# Patient Record
Sex: Female | Born: 1975 | Race: White | Hispanic: No | Marital: Married | Smoking: Never smoker
Health system: Southern US, Community
[De-identification: ages and names within clinical notes are randomized; demographics above are authoritative.]

## PROBLEM LIST (undated history)

## (undated) DIAGNOSIS — R51 Headache: Secondary | ICD-10-CM

## (undated) DIAGNOSIS — D72829 Elevated white blood cell count, unspecified: Secondary | ICD-10-CM

## (undated) DIAGNOSIS — R519 Headache, unspecified: Secondary | ICD-10-CM

## (undated) DIAGNOSIS — K760 Fatty (change of) liver, not elsewhere classified: Secondary | ICD-10-CM

## (undated) DIAGNOSIS — T8859XA Other complications of anesthesia, initial encounter: Secondary | ICD-10-CM

## (undated) DIAGNOSIS — Z8719 Personal history of other diseases of the digestive system: Secondary | ICD-10-CM

## (undated) DIAGNOSIS — R112 Nausea with vomiting, unspecified: Secondary | ICD-10-CM

## (undated) DIAGNOSIS — I10 Essential (primary) hypertension: Secondary | ICD-10-CM

## (undated) DIAGNOSIS — F419 Anxiety disorder, unspecified: Secondary | ICD-10-CM

## (undated) DIAGNOSIS — Z9889 Other specified postprocedural states: Secondary | ICD-10-CM

## (undated) DIAGNOSIS — D580 Hereditary spherocytosis: Secondary | ICD-10-CM

## (undated) DIAGNOSIS — M7542 Impingement syndrome of left shoulder: Secondary | ICD-10-CM

## (undated) DIAGNOSIS — T4145XA Adverse effect of unspecified anesthetic, initial encounter: Secondary | ICD-10-CM

## (undated) DIAGNOSIS — M1990 Unspecified osteoarthritis, unspecified site: Secondary | ICD-10-CM

## (undated) HISTORY — PX: WISDOM TOOTH EXTRACTION: SHX21

## (undated) HISTORY — PX: UPPER GI ENDOSCOPY: SHX6162

## (undated) HISTORY — PX: MYRINGOTOMY: SHX2060

## (undated) HISTORY — PX: SHOULDER ARTHROSCOPY: SHX128

## (undated) HISTORY — PX: TONSILLECTOMY: SUR1361

## (undated) HISTORY — PX: SPLENECTOMY, TOTAL: SHX788

## (undated) HISTORY — PX: COLONOSCOPY: SHX174

## (undated) HISTORY — PX: FINGER SURGERY: SHX640

---

## 1997-09-03 ENCOUNTER — Other Ambulatory Visit: Admission: RE | Admit: 1997-09-03 | Discharge: 1997-09-03 | Payer: Self-pay | Admitting: Obstetrics & Gynecology

## 1997-11-05 ENCOUNTER — Inpatient Hospital Stay (HOSPITAL_COMMUNITY): Admission: AD | Admit: 1997-11-05 | Discharge: 1997-11-07 | Payer: Self-pay | Admitting: Obstetrics & Gynecology

## 1997-12-22 ENCOUNTER — Inpatient Hospital Stay (HOSPITAL_COMMUNITY): Admission: AD | Admit: 1997-12-22 | Discharge: 1997-12-24 | Payer: Self-pay | Admitting: Obstetrics & Gynecology

## 1998-01-22 ENCOUNTER — Other Ambulatory Visit: Admission: RE | Admit: 1998-01-22 | Discharge: 1998-01-22 | Payer: Self-pay | Admitting: Obstetrics and Gynecology

## 1998-05-16 ENCOUNTER — Encounter: Admission: RE | Admit: 1998-05-16 | Discharge: 1998-05-16 | Payer: Self-pay | Admitting: Family Medicine

## 1998-06-11 ENCOUNTER — Encounter: Admission: RE | Admit: 1998-06-11 | Discharge: 1998-06-11 | Payer: Self-pay | Admitting: Sports Medicine

## 1998-12-20 ENCOUNTER — Emergency Department (HOSPITAL_COMMUNITY): Admission: EM | Admit: 1998-12-20 | Discharge: 1998-12-20 | Payer: Self-pay | Admitting: Emergency Medicine

## 1998-12-20 ENCOUNTER — Encounter: Payer: Self-pay | Admitting: Emergency Medicine

## 1999-04-02 ENCOUNTER — Inpatient Hospital Stay (HOSPITAL_COMMUNITY): Admission: AD | Admit: 1999-04-02 | Discharge: 1999-04-02 | Payer: Self-pay | Admitting: Obstetrics and Gynecology

## 1999-10-06 ENCOUNTER — Emergency Department (HOSPITAL_COMMUNITY): Admission: EM | Admit: 1999-10-06 | Discharge: 1999-10-06 | Payer: Self-pay | Admitting: Emergency Medicine

## 2000-03-29 ENCOUNTER — Other Ambulatory Visit: Admission: RE | Admit: 2000-03-29 | Discharge: 2000-03-29 | Payer: Self-pay | Admitting: Obstetrics and Gynecology

## 2000-03-31 ENCOUNTER — Encounter: Payer: Self-pay | Admitting: Emergency Medicine

## 2000-03-31 ENCOUNTER — Inpatient Hospital Stay (HOSPITAL_COMMUNITY): Admission: EM | Admit: 2000-03-31 | Discharge: 2000-04-01 | Payer: Self-pay | Admitting: Emergency Medicine

## 2000-04-08 ENCOUNTER — Emergency Department (HOSPITAL_COMMUNITY): Admission: EM | Admit: 2000-04-08 | Discharge: 2000-04-08 | Payer: Self-pay | Admitting: Emergency Medicine

## 2000-04-08 ENCOUNTER — Encounter: Payer: Self-pay | Admitting: Emergency Medicine

## 2001-05-25 ENCOUNTER — Encounter: Payer: Self-pay | Admitting: Family Medicine

## 2001-05-25 ENCOUNTER — Ambulatory Visit (HOSPITAL_COMMUNITY): Admission: RE | Admit: 2001-05-25 | Discharge: 2001-05-25 | Payer: Self-pay | Admitting: Family Medicine

## 2001-08-30 ENCOUNTER — Other Ambulatory Visit: Admission: RE | Admit: 2001-08-30 | Discharge: 2001-08-30 | Payer: Self-pay | Admitting: Obstetrics and Gynecology

## 2002-01-17 ENCOUNTER — Other Ambulatory Visit: Admission: RE | Admit: 2002-01-17 | Discharge: 2002-01-17 | Payer: Self-pay | Admitting: Obstetrics and Gynecology

## 2002-01-26 ENCOUNTER — Encounter: Payer: Self-pay | Admitting: Obstetrics and Gynecology

## 2002-01-26 ENCOUNTER — Inpatient Hospital Stay (HOSPITAL_COMMUNITY): Admission: AD | Admit: 2002-01-26 | Discharge: 2002-01-26 | Payer: Self-pay | Admitting: *Deleted

## 2002-03-09 ENCOUNTER — Inpatient Hospital Stay (HOSPITAL_COMMUNITY): Admission: AD | Admit: 2002-03-09 | Discharge: 2002-03-09 | Payer: Self-pay | Admitting: Obstetrics & Gynecology

## 2002-03-10 ENCOUNTER — Inpatient Hospital Stay (HOSPITAL_COMMUNITY): Admission: AD | Admit: 2002-03-10 | Discharge: 2002-03-10 | Payer: Self-pay | Admitting: Obstetrics and Gynecology

## 2002-03-27 ENCOUNTER — Inpatient Hospital Stay (HOSPITAL_COMMUNITY): Admission: AD | Admit: 2002-03-27 | Discharge: 2002-03-29 | Payer: Self-pay | Admitting: Obstetrics and Gynecology

## 2002-04-17 ENCOUNTER — Other Ambulatory Visit: Admission: RE | Admit: 2002-04-17 | Discharge: 2002-04-17 | Payer: Self-pay | Admitting: Obstetrics and Gynecology

## 2002-04-20 ENCOUNTER — Ambulatory Visit (HOSPITAL_COMMUNITY): Admission: RE | Admit: 2002-04-20 | Discharge: 2002-04-20 | Payer: Self-pay | Admitting: Obstetrics and Gynecology

## 2002-04-20 HISTORY — PX: TUBAL LIGATION: SHX77

## 2002-10-07 ENCOUNTER — Encounter: Payer: Self-pay | Admitting: Emergency Medicine

## 2002-10-07 ENCOUNTER — Emergency Department (HOSPITAL_COMMUNITY): Admission: EM | Admit: 2002-10-07 | Discharge: 2002-10-07 | Payer: Self-pay | Admitting: Emergency Medicine

## 2003-07-03 ENCOUNTER — Other Ambulatory Visit: Admission: RE | Admit: 2003-07-03 | Discharge: 2003-07-03 | Payer: Self-pay | Admitting: Obstetrics and Gynecology

## 2003-10-07 ENCOUNTER — Emergency Department (HOSPITAL_COMMUNITY): Admission: EM | Admit: 2003-10-07 | Discharge: 2003-10-07 | Payer: Self-pay | Admitting: *Deleted

## 2004-06-30 ENCOUNTER — Other Ambulatory Visit: Admission: RE | Admit: 2004-06-30 | Discharge: 2004-06-30 | Payer: Self-pay | Admitting: Obstetrics and Gynecology

## 2008-11-05 ENCOUNTER — Inpatient Hospital Stay (HOSPITAL_COMMUNITY): Admission: EM | Admit: 2008-11-05 | Discharge: 2008-11-08 | Payer: Self-pay | Admitting: Emergency Medicine

## 2008-11-06 ENCOUNTER — Other Ambulatory Visit: Payer: Self-pay | Admitting: Obstetrics and Gynecology

## 2008-11-08 ENCOUNTER — Other Ambulatory Visit: Payer: Self-pay | Admitting: Obstetrics & Gynecology

## 2010-03-05 ENCOUNTER — Emergency Department (HOSPITAL_COMMUNITY)
Admission: EM | Admit: 2010-03-05 | Discharge: 2010-03-05 | Payer: Self-pay | Source: Home / Self Care | Admitting: Emergency Medicine

## 2010-03-09 ENCOUNTER — Encounter: Payer: Self-pay | Admitting: Family Medicine

## 2010-03-10 LAB — DIFFERENTIAL
Basophils Relative: 1 % (ref 0–1)
Eosinophils Absolute: 0.2 10*3/uL (ref 0.0–0.7)
Lymphs Abs: 4 10*3/uL (ref 0.7–4.0)
Monocytes Absolute: 1.4 10*3/uL — ABNORMAL HIGH (ref 0.1–1.0)
Monocytes Relative: 11 % (ref 3–12)
Neutro Abs: 6.5 10*3/uL (ref 1.7–7.7)

## 2010-03-10 LAB — URINALYSIS, ROUTINE W REFLEX MICROSCOPIC
Leukocytes, UA: NEGATIVE
Protein, ur: NEGATIVE mg/dL
Urobilinogen, UA: 0.2 mg/dL (ref 0.0–1.0)

## 2010-03-10 LAB — COMPREHENSIVE METABOLIC PANEL
AST: 34 U/L (ref 0–37)
BUN: 10 mg/dL (ref 6–23)
CO2: 23 mEq/L (ref 19–32)
Calcium: 9.1 mg/dL (ref 8.4–10.5)
Creatinine, Ser: 0.65 mg/dL (ref 0.4–1.2)
GFR calc Af Amer: >60 mL/min (ref >60)
GFR calc non Af Amer: >60 mL/min (ref >60)
Glucose, Bld: 78 mg/dL (ref 70–99)
Total Bilirubin: 1.4 mg/dL — ABNORMAL HIGH (ref 0.3–1.2)

## 2010-03-10 LAB — CBC
Hemoglobin: 14.1 g/dL (ref 12.0–15.0)
MCH: 32.2 pg (ref 26.0–34.0)
MCHC: 35.8 g/dL (ref 30.0–36.0)
MCV: 90 fL (ref 78.0–100.0)

## 2010-03-10 LAB — WET PREP, GENITAL
Clue Cells Wet Prep HPF POC: NONE SEEN
Trich, Wet Prep: NONE SEEN
Yeast Wet Prep HPF POC: NONE SEEN

## 2010-03-10 LAB — LIPASE, BLOOD: Lipase: 23 U/L (ref 11–59)

## 2010-03-10 LAB — URINE MICROSCOPIC-ADD ON

## 2010-05-23 LAB — URINALYSIS, ROUTINE W REFLEX MICROSCOPIC
Nitrite: NEGATIVE
Specific Gravity, Urine: 1.027 (ref 1.005–1.030)
Urobilinogen, UA: 1 mg/dL (ref 0.0–1.0)
pH: 6 (ref 5.0–8.0)

## 2010-05-23 LAB — COMPREHENSIVE METABOLIC PANEL
ALT: 21 U/L (ref 0–35)
AST: 17 U/L (ref 0–37)
Albumin: 3.2 g/dL — ABNORMAL LOW (ref 3.5–5.2)
Albumin: 4.1 g/dL (ref 3.5–5.2)
Albumin: 3.3 g/dL — ABNORMAL LOW (ref 3.5–5.2)
Alkaline Phosphatase: 52 U/L (ref 39–117)
Alkaline Phosphatase: 65 U/L (ref 39–117)
BUN: 6 mg/dL (ref 6–23)
BUN: 5 mg/dL — ABNORMAL LOW (ref 6–23)
CO2: 24 mEq/L (ref 19–32)
Chloride: 105 mEq/L (ref 96–112)
Creatinine, Ser: 0.56 mg/dL (ref 0.4–1.2)
Creatinine, Ser: 0.63 mg/dL (ref 0.4–1.2)
Creatinine, Ser: 0.65 mg/dL (ref 0.4–1.2)
GFR calc Af Amer: >60 mL/min (ref >60)
GFR calc Af Amer: >60 mL/min (ref >60)
GFR calc non Af Amer: >60 mL/min (ref >60)
Glucose, Bld: 93 mg/dL (ref 70–99)
Potassium: 3.2 mEq/L — ABNORMAL LOW (ref 3.5–5.1)
Potassium: 3.9 mEq/L (ref 3.5–5.1)
Sodium: 137 mEq/L (ref 135–145)
Total Bilirubin: 1.3 mg/dL — ABNORMAL HIGH (ref 0.3–1.2)
Total Protein: 7.1 g/dL (ref 6.0–8.3)
Total Protein: 6 g/dL (ref 6.0–8.3)

## 2010-05-23 LAB — CBC
HCT: 40.8 % (ref 36.0–46.0)
HCT: 36 % (ref 36.0–46.0)
HCT: 37.5 % (ref 36.0–46.0)
Hemoglobin: 15 g/dL (ref 12.0–15.0)
MCHC: 35.9 g/dL (ref 30.0–36.0)
MCHC: 36 g/dL (ref 30.0–36.0)
MCV: 92.1 fL (ref 78.0–100.0)
MCV: 93.7 fL (ref 78.0–100.0)
MCV: 94.4 fL (ref 78.0–100.0)
Platelets: 497 10*3/uL — ABNORMAL HIGH (ref 150–400)
Platelets: 552 10*3/uL — ABNORMAL HIGH (ref 150–400)
Platelets: 535 10*3/uL — ABNORMAL HIGH (ref 150–400)
Platelets: 575 10*3/uL — ABNORMAL HIGH (ref 150–400)
RBC: 3.73 MIL/uL — ABNORMAL LOW (ref 3.87–5.11)
RDW: 13 % (ref 11.5–15.5)
RDW: 12.8 % (ref 11.5–15.5)
WBC: 17.5 10*3/uL — ABNORMAL HIGH (ref 4.0–10.5)
WBC: 12.3 10*3/uL — ABNORMAL HIGH (ref 4.0–10.5)

## 2010-05-23 LAB — WET PREP, GENITAL
Trich, Wet Prep: NONE SEEN
Yeast Wet Prep HPF POC: NONE SEEN

## 2010-05-23 LAB — DIFFERENTIAL
Basophils Absolute: 0.1 10*3/uL (ref 0.0–0.1)
Basophils Relative: 1 % (ref 0–1)
Basophils Relative: 2 % — ABNORMAL HIGH (ref 0–1)
Eosinophils Absolute: 0.2 10*3/uL (ref 0.0–0.7)
Eosinophils Relative: 1 % (ref 0–5)
Lymphocytes Relative: 16 % (ref 12–46)
Lymphocytes Relative: 36 % (ref 12–46)
Lymphs Abs: 3.6 10*3/uL (ref 0.7–4.0)
Monocytes Absolute: 1.7 10*3/uL — ABNORMAL HIGH (ref 0.1–1.0)
Monocytes Absolute: 1.4 10*3/uL — ABNORMAL HIGH (ref 0.1–1.0)
Monocytes Relative: 8 % (ref 3–12)
Monocytes Relative: 11 % (ref 3–12)
Neutro Abs: 16.2 10*3/uL — ABNORMAL HIGH (ref 1.7–7.7)
Neutro Abs: 6.1 10*3/uL (ref 1.7–7.7)
Neutrophils Relative %: 75 % (ref 43–77)

## 2010-05-23 LAB — URINE MICROSCOPIC-ADD ON

## 2010-05-23 LAB — GENTAMICIN LEVEL, RANDOM: Gentamicin Rm: 0.7 ug/mL

## 2010-07-04 ENCOUNTER — Inpatient Hospital Stay (INDEPENDENT_AMBULATORY_CARE_PROVIDER_SITE_OTHER)
Admission: RE | Admit: 2010-07-04 | Discharge: 2010-07-04 | Disposition: A | Discharge status: Home / Self Care | Payer: BC Managed Care – PPO | Source: Ambulatory Visit | Attending: Family Medicine | Admitting: Family Medicine

## 2010-07-04 DIAGNOSIS — T50995A Adverse effect of other drugs, medicaments and biological substances, initial encounter: Secondary | ICD-10-CM

## 2010-07-04 DIAGNOSIS — I10 Essential (primary) hypertension: Secondary | ICD-10-CM

## 2010-07-04 LAB — POCT URINALYSIS DIP (DEVICE)
Ketones, ur: NEGATIVE mg/dL
Protein, ur: NEGATIVE mg/dL
Specific Gravity, Urine: <=1.005 (ref 1.005–1.030)
pH: 6.5 (ref 5.0–8.0)

## 2010-07-04 LAB — POCT PREGNANCY, URINE: Preg Test, Ur: NEGATIVE

## 2010-07-04 NOTE — Op Note (Signed)
NAME:  Bonnie Gibson, Bonnie Gibson                           ACCOUNT NO.:  [id number]   MEDICAL RECORD NO.:  [id number]                   PATIENT TYPE:  AMB   LOCATION:  SDC                                  FACILITY:  [facility]   PHYSICIAN:  Malva Limes, M.D.                 DATE OF BIRTH:  [birth year]   DATE OF PROCEDURE:  04/20/2002  DATE OF DISCHARGE:                                 OPERATIVE REPORT   PREOPERATIVE DIAGNOSES:  The patient desires permanent sterilization.   POSTOPERATIVE DIAGNOSES:  The patient desires permanent sterilization.   PROCEDURE:  1. Laparoscopic bilateral tubal ligation.  2. Application of Hulka clips.   SURGEON:  Malva Limes, M.D.   ANESTHESIA:  General.   ANTIBIOTICS:  Ancef 1 g.   DRAINS:  Red rubber catheter bladder.   ESTIMATED BLOOD LOSS:  Minimal.   COMPLICATIONS:  None.   SPECIMENS:  None.   FINDINGS:  The patient had normal fallopian tubes and ovaries bilaterally.  There was no evidence of adhesions or endometriosis.  The abdominal cavity  appeared to be normal.   INDICATIONS:  The patient is a 35 year old white female who underwent  vaginal delivery approximately three weeks ago without complications.  The  patient expressed her desire for permanent sterilization.  Prior to having  the procedure performed patient expressed her understanding of the intended  permanence, possible failure rates, possible change in menstrual cycles.  She also expressed her understanding of the possible risks and  complications.   PROCEDURE:  The patient was taken to the operating room where she was placed  in a dorsal supine position.  A general anesthetic was administered without  complications.  She was then placed in the dorsal lithotomy position.  She  was prepped with Hibiclens and a Hulka tenaculum applied to the anterior  cervical lip.  The patient was draped in the usual fashion for this  procedure.  An open laparoscopy was performed because the patient  had a  previous splenectomy.  The patient had a vertical skin incision made.  This  was carried down to the fascia.  The fascia was entered with the Mayo  scissors.  Parietoperitoneum was entered bluntly.  Sutures were placed in  the fascia and the Hasson cannula placed into the abdominal cavity.  3 L of  carbon dioxide was insufflated.  The scope was in place.  The patient was  placed in Trendelenburg.  Examination of the abdominal and pelvic contents  was then undertaken.  At this point Hulka clip was applied to the left  fallopian tube in the isthmic region.  The clip was applied perpendicular to  the tube.  The entire tube appeared to be within the clasp.  The clasp  appeared to be tightly closed.  A similar procedure was performed on the  opposite side.  After this the procedure was concluded, the instruments  removed,  and pneumoperitoneum released.  Fascia was closed with interrupted  0 Vicryl suture.  Skin was closed with interrupted 4-0 Vicryl suture.  The  patient tolerated the procedure well.  She was taken to the recovery room in  stable condition.  Instrument and lap counts were correct x2.                                               Malva Limes, M.D.    MA/MEDQ  D:  04/20/2002  T:  04/20/2002  Job:  [id number]

## 2010-07-04 NOTE — Discharge Summary (Signed)
[place]  Patient:    Bonnie Gibson, Bonnie Gibson                        MRN: [id number] Adm. Date:  [id number] Disc. Date: [id number] Attending:  Virgina Evener Dictator:   Marya Fossa, P.A. CC:         [facility], [place], [place]   Discharge Summary  DATE OF BIRTH:  1975/10/23  ADMISSION DIAGNOSES: 1. Sinus tachycardia. 2. Anxiety. 3. Hereditary spherocytosis. 4. Excessive caffeine intake. 5. Birth control pill use.  DISCHARGE DIAGNOSES: 1. Sinus tachycardia with palpitations; suggests probable mild mitral valve    prolapse.  A 2-D echocardiogram is pending. 2. Anxiety. 3. Hereditary spherocytosis. 4. Excessive caffeine intake. 5. Birth control pill use.  HISTORY OF PRESENT ILLNESS:  Ms. Salvador is a 35 year old white female with no prior past medical history.  She presents to [facility] with complaints of chest pain and tachycardia.  She was woke up this morning feeling "weird all over" and nauseated.  No vomiting.  She felt like her heart was racing and she was short of breath.  Her father took her pulse and said that it was greater than March 17, 2000.  It calmed down by the middle of the day.  However, when she would stay up, she would feel dizzy and weak.  She came to the emergency room around 8:30 a.m. on March 31, 2000, and was seen by emergency room physicians.  A CT scan was ordered that ruled out pulmonary embolism.  A cardiology consultation was requested.  It was noted in the emergency room that every time she gets anxious, her heart rate goes up into the 110s-120s.  The patient will be admitted for observation and evaluation of tachycardia.  Of note, the patient drinks over 2 liters of caffeine a day.  PROCEDURES: 1. 2-D echocardiogram. 2. Bilateral lower extremity venous duplex.  COMPLICATIONS:  None.  CONSULTATIONS:  None.  COURSE IN THE HOSPITAL:  The patient was admitted for sinus  tachycardia and given Lopressor p.o.  She symptomatically improved with no further palpitations.  A battery of laboratory studies were drawn, including a CBC with WBC 9.4, hemoglobin 14.4, and platelets 586.  BMP within normal limits.  LFTs within normal limits.  T4 7.6, T3 uptake 32.5, TSH 0.851, T3 152.0.  Urine hCG negative.  UA negative.  Chest CT negative for pulmonary embolism or other acute process in the chest. THere was a question of a tiny non-occlusive thrombus in the left profunda femoris vein just below the groin.  No other evidence of DVT.  A venous duplex study was ordered and performed on April 01, 2000, which showed no evidence of lower extremity DVT, superficial thrombosis, or Bakers cysts bilaterally. The 2-D echocardiogram is pending at the time of discharge.  The patient symptomatically improved.  She probably has sinus tachycardia related to anxiety and a component of mitral valve prolapse.  Apparently her mother has this.  DISCHARGE MEDICATIONS:  The patient will be discharged home on Toprol XL 25 mg a day and Ativan 0.5 mg one p.o. b.i.d. as needed for anxiety.  She will continue her oral contraceptives as before.  ACTIVITY:  As tolerated.  DIET:  The patient is to decrease her caffeine significantly and try to avoid other stimulants.  FOLLOW-UP:  She is asked to call the office for any problems or questions. She will see Lennette Bihari,  M.D., back in the office on May 11, 2000, at 10:30 a.m. DD:  04/01/00 TD:  04/02/00 Job: [id number]

## 2011-09-11 ENCOUNTER — Emergency Department (HOSPITAL_COMMUNITY)
Admission: EM | Admit: 2011-09-11 | Discharge: 2011-09-11 | Disposition: A | Discharge status: Home / Self Care | Payer: 59 | Attending: Emergency Medicine | Admitting: Emergency Medicine

## 2011-09-11 ENCOUNTER — Encounter (HOSPITAL_COMMUNITY): Payer: Self-pay | Admitting: Emergency Medicine

## 2011-09-11 ENCOUNTER — Emergency Department (HOSPITAL_COMMUNITY): Payer: 59

## 2011-09-11 DIAGNOSIS — R109 Unspecified abdominal pain: Secondary | ICD-10-CM

## 2011-09-11 LAB — CBC
MCH: 33.1 pg (ref 26.0–34.0)
Platelets: 675 10*3/uL — ABNORMAL HIGH (ref 150–400)
RBC: 4.5 MIL/uL (ref 3.87–5.11)
WBC: 13.2 10*3/uL — ABNORMAL HIGH (ref 4.0–10.5)

## 2011-09-11 LAB — URINALYSIS, ROUTINE W REFLEX MICROSCOPIC
Bilirubin Urine: NEGATIVE
Glucose, UA: NEGATIVE mg/dL
Ketones, ur: NEGATIVE mg/dL
pH: 6.5 (ref 5.0–8.0)

## 2011-09-11 LAB — COMPREHENSIVE METABOLIC PANEL
AST: 28 U/L (ref 0–37)
BUN: 10 mg/dL (ref 6–23)
CO2: 26 mEq/L (ref 19–32)
Chloride: 103 mEq/L (ref 96–112)
Creatinine, Ser: 0.55 mg/dL (ref 0.50–1.10)
GFR calc Af Amer: >90 mL/min (ref >90)
GFR calc non Af Amer: >90 mL/min (ref >90)
Glucose, Bld: 115 mg/dL — ABNORMAL HIGH (ref 70–99)
Total Bilirubin: 2.6 mg/dL — ABNORMAL HIGH (ref 0.3–1.2)

## 2011-09-11 LAB — POCT PREGNANCY, URINE: Preg Test, Ur: NEGATIVE

## 2011-09-11 LAB — URINE MICROSCOPIC-ADD ON

## 2011-09-11 MED ORDER — DIPHENHYDRAMINE HCL 50 MG/ML IJ SOLN
25.0000 mg | Freq: Once | INTRAMUSCULAR | Status: AC
  Administered 2011-09-11: 25 mg via INTRAVENOUS
  Filled 2011-09-11: qty 1

## 2011-09-11 MED ORDER — HYDROMORPHONE HCL PF 1 MG/ML IJ SOLN
1.0000 mg | Freq: Once | INTRAMUSCULAR | Status: AC
  Administered 2011-09-11: 1 mg via INTRAVENOUS
  Filled 2011-09-11: qty 1

## 2011-09-11 MED ORDER — ONDANSETRON HCL 4 MG/2ML IJ SOLN
4.0000 mg | Freq: Once | INTRAMUSCULAR | Status: AC
  Administered 2011-09-11: 4 mg via INTRAVENOUS
  Filled 2011-09-11: qty 2

## 2011-09-11 MED ORDER — IOHEXOL 300 MG/ML  SOLN
100.0000 mL | Freq: Once | INTRAMUSCULAR | Status: AC | PRN
  Administered 2011-09-11: 100 mL via INTRAVENOUS

## 2011-09-11 MED ORDER — IOHEXOL 300 MG/ML  SOLN
20.0000 mL | INTRAMUSCULAR | Status: AC
  Administered 2011-09-11 (×2): 20 mL via ORAL

## 2011-09-11 MED ORDER — POTASSIUM CHLORIDE CRYS ER 20 MEQ PO TBCR
40.0000 meq | EXTENDED_RELEASE_TABLET | Freq: Once | ORAL | Status: AC
  Administered 2011-09-11: 40 meq via ORAL
  Filled 2011-09-11: qty 2

## 2011-09-11 MED ORDER — ONDANSETRON HCL 4 MG/2ML IJ SOLN
4.0000 mg | Freq: Once | INTRAMUSCULAR | Status: AC
  Administered 2011-09-11: 4 mg via INTRAVENOUS

## 2011-09-11 MED ORDER — ONDANSETRON HCL 4 MG/2ML IJ SOLN
INTRAMUSCULAR | Status: AC
  Filled 2011-09-11: qty 2

## 2011-09-11 MED ORDER — OXYCODONE-ACETAMINOPHEN 5-325 MG PO TABS: 2.0000 | ORAL_TABLET | ORAL | Status: AC | PRN

## 2011-09-11 MED ORDER — ONDANSETRON 8 MG PO TBDP: 8.0000 mg | ORAL_TABLET | Freq: Three times a day (TID) | ORAL | Status: AC | PRN

## 2011-09-11 NOTE — ED Notes (Signed)
C/o lower abd "aching" since Tuesday.  Reports nausea and vomiting that started at 4am.  States she took Phenergan this morning and has been sleeping all day. Now having RLQ pain with nausea.

## 2011-09-11 NOTE — ED Notes (Signed)
Received report from [facility]. Pt has been having n/v/d since Tuesday. RLQ pain. No rebound but tender to touch. Pt states that she has been having black stools. No blood noted in vomit. No cardiac or respiratory distress. Will continue to monitor. Pt currently drinking contrast. Will continue to monitor.

## 2011-09-11 NOTE — ED Provider Notes (Addendum)
History     CSN: [id number]  Arrival date & time 09/11/11  1629   First MD Initiated Contact with Patient 09/11/11 1858      Chief Complaint  Patient presents with  . Abdominal Pain    (Consider location/radiation/quality/duration/timing/severity/associated sxs/prior treatment) Patient is a 36 y.o. female presenting with abdominal pain. The history is provided by the patient.  Abdominal Pain The primary symptoms of the illness include abdominal pain.   patient here with right lower quadrant pain x1 week Without vaginal bleeding or discharge. Pain described as sharp worse with movement . Nothing makes her symptoms better. No prior history of same. Denies any dysuria or hematuria. No association with food. Has used over-the-counter medications without relief.   History reviewed. No pertinent past medical history.  History reviewed. No pertinent past surgical history.  No family history on file.  History  Substance Use Topics  . Smoking status: Never Smoker   . Smokeless tobacco: Not on file  . Alcohol Use: No    OB History    Grav Para Term Preterm Abortions TAB SAB Ect Mult Living                  Review of Systems  Gastrointestinal: Positive for abdominal pain.  All other systems reviewed and are negative.    Allergies  Review of patient's allergies indicates no known allergies.  Home Medications   Current Outpatient Rx  Name Route Sig Dispense Refill  . ALPRAZOLAM 0.25 MG PO TABS Oral Take 0.25 mg by mouth 3 (three) times daily as needed. For anxiety    . IBUPROFEN 200 MG PO TABS Oral Take 1,000 mg by mouth every 6 (six) hours as needed. For pain    . PROMETHAZINE HCL 25 MG PO TABS Oral Take 25 mg by mouth every 6 (six) hours as needed. For nausea    . PROPRANOLOL HCL 20 MG PO TABS Oral Take 20 mg by mouth 3 (three) times daily.      BP 128/87  Pulse 121  Temp 98.2 F (36.8 C) (Oral)  Resp 16  SpO2 100%  LMP 08/28/2011  Physical Exam  Nursing note  and vitals reviewed. Constitutional: She is oriented to person, place, and time. She appears well-developed and well-nourished.  Non-toxic appearance. No distress.  HENT:  Head: Normocephalic and atraumatic.  Eyes: Conjunctivae, EOM and lids are normal. Pupils are equal, round, and reactive to light.  Neck: Normal range of motion. Neck supple. No tracheal deviation present. No mass present.  Cardiovascular: Regular rhythm and normal heart sounds.  Tachycardia present.  Exam reveals no gallop.   No murmur heard. Pulmonary/Chest: Effort normal and breath sounds normal. No stridor. No respiratory distress. She has no decreased breath sounds. She has no wheezes. She has no rhonchi. She has no rales.  Abdominal: Soft. Normal appearance and bowel sounds are normal. She exhibits no distension. There is tenderness in the right lower quadrant. There is no rigidity, no rebound, no guarding and no CVA tenderness.  Musculoskeletal: Normal range of motion. She exhibits no edema and no tenderness.  Neurological: She is alert and oriented to person, place, and time. She has normal strength. No cranial nerve deficit or sensory deficit. GCS eye subscore is 4. GCS verbal subscore is 5. GCS motor subscore is 6.  Skin: Skin is warm and dry. No abrasion and no rash noted.  Psychiatric: She has a normal mood and affect. Her speech is normal and behavior is normal.  ED Course  Procedures (including critical care time)  Labs Reviewed  URINALYSIS, ROUTINE W REFLEX MICROSCOPIC - Abnormal; Notable for the following:    APPearance HAZY (*)     Hgb urine dipstick SMALL (*)     Leukocytes, UA SMALL (*)     All other components within normal limits  COMPREHENSIVE METABOLIC PANEL - Abnormal; Notable for the following:    Potassium 3.1 (*)     Glucose, Bld 115 (*)     Total Bilirubin 2.6 (*)     All other components within normal limits  CBC - Abnormal; Notable for the following:    WBC 13.2 (*)     MCHC 36.3 (*)      Platelets 675 (*)     All other components within normal limits  URINE MICROSCOPIC-ADD ON - Abnormal; Notable for the following:    Squamous Epithelial / LPF MANY (*)     Bacteria, UA MANY (*)     All other components within normal limits  POCT PREGNANCY, URINE   No results found.   No diagnosis found.    MDM  Pt given pain meds and feels better, abd ct without acute process--potassium was low and replinished  10:13 PM Repeat abd exam remains non-surgical      Toy Baker, MD 09/11/11 5784  Toy Baker, MD 09/11/11 6962  Toy Baker, MD 09/11/11 2214

## 2011-09-11 NOTE — ED Notes (Signed)
Pt unable to void at this time. 

## 2011-09-11 NOTE — ED Notes (Signed)
Pt reports N/V and severe abdominal pain since tues that has been increasing.  Abdomin is tender to touch and localized on lower right side . Pt alert oriented X4.  Pt is missing her spleen and is not concerned that white blood count is elevated.

## 2011-09-11 NOTE — ED Notes (Signed)
Pt ambulated in hallways with no issues. EDP made aware.

## 2011-10-01 ENCOUNTER — Emergency Department (HOSPITAL_COMMUNITY)
Admission: EM | Admit: 2011-10-01 | Discharge: 2011-10-02 | Disposition: A | Discharge status: Home / Self Care | Payer: 59 | Attending: Emergency Medicine | Admitting: Emergency Medicine

## 2011-10-01 ENCOUNTER — Encounter (HOSPITAL_COMMUNITY): Payer: Self-pay | Admitting: Family Medicine

## 2011-10-01 ENCOUNTER — Emergency Department (HOSPITAL_COMMUNITY): Payer: 59

## 2011-10-01 DIAGNOSIS — Y9364 Activity, baseball: Secondary | ICD-10-CM | POA: Insufficient documentation

## 2011-10-01 DIAGNOSIS — Y998 Other external cause status: Secondary | ICD-10-CM | POA: Insufficient documentation

## 2011-10-01 DIAGNOSIS — M1710 Unilateral primary osteoarthritis, unspecified knee: Secondary | ICD-10-CM | POA: Insufficient documentation

## 2011-10-01 DIAGNOSIS — S93402A Sprain of unspecified ligament of left ankle, initial encounter: Secondary | ICD-10-CM

## 2011-10-01 DIAGNOSIS — M25562 Pain in left knee: Secondary | ICD-10-CM

## 2011-10-01 DIAGNOSIS — S93409A Sprain of unspecified ligament of unspecified ankle, initial encounter: Secondary | ICD-10-CM | POA: Insufficient documentation

## 2011-10-01 DIAGNOSIS — X58XXXA Exposure to other specified factors, initial encounter: Secondary | ICD-10-CM | POA: Insufficient documentation

## 2011-10-01 MED ORDER — HYDROCODONE-ACETAMINOPHEN 5-325 MG PO TABS
2.0000 | ORAL_TABLET | Freq: Once | ORAL | Status: AC
  Administered 2011-10-01: 2 via ORAL
  Filled 2011-10-01: qty 2

## 2011-10-01 MED ORDER — ONDANSETRON 4 MG PO TBDP: 4.0000 mg | ORAL_TABLET | Freq: Three times a day (TID) | ORAL | Status: AC | PRN

## 2011-10-01 MED ORDER — HYDROCODONE-ACETAMINOPHEN 5-500 MG PO TABS: 1.0000 | ORAL_TABLET | Freq: Four times a day (QID) | ORAL | Status: AC | PRN

## 2011-10-01 MED ORDER — ONDANSETRON 4 MG PO TBDP
4.0000 mg | ORAL_TABLET | Freq: Once | ORAL | Status: AC
Start: 2011-10-01 — End: 2011-10-01
  Administered 2011-10-01: 4 mg via ORAL
  Filled 2011-10-01: qty 1

## 2011-10-01 NOTE — Progress Notes (Signed)
Orthopedic Tech Progress Note Patient Details:  Bonnie Gibson 1975-05-27 [id number]  Patient ID: [name], female   DOB: 04/03/1975, 36 y.o.   MRN: [id number]  Now crutches appear in ortho charges [name] 10/01/2011, 11:32 PM

## 2011-10-01 NOTE — ED Notes (Signed)
Bed:WTR6<BR> Expected date:<BR> Expected time:<BR> Means of arrival:<BR> Comments:<BR>

## 2011-10-01 NOTE — ED Notes (Signed)
Patient states that she was playing softball and injured her left leg and left ankle. Pain with weight bearing.

## 2011-10-01 NOTE — Progress Notes (Signed)
Orthopedic Tech Progress Note Patient Details:  Bonnie Gibson [birth year] [id number]  Patient ID: Bonnie Gibson, female   DOB: August 02, 1975, 36 y.o.   MRN: [id number] Crutches provided;not showing up in charges.;clicked off in supplies  [place], Lynell Kussman 10/01/2011, 11:29 PM

## 2011-10-01 NOTE — Progress Notes (Signed)
Orthopedic Tech Progress Note Patient Details:  Bonnie Gibson 04/08/1975 [id number]  Ortho Devices Type of Ortho Device: Crutches;Ace wrap Ortho Device/Splint Location: left ankle Ortho Device/Splint Interventions: Application   Seann Genther 10/01/2011, 11:29 PM

## 2011-10-02 NOTE — ED Provider Notes (Signed)
History     CSN: [id number]  Arrival date & time 10/01/11  2103   First MD Initiated Contact with Patient 10/01/11 2313      Chief Complaint  Patient presents with  . Leg Pain    left side  . Ankle Pain    left side    (Consider location/radiation/quality/duration/timing/severity/associated sxs/prior treatment) HPI  36 year old female in no acute distress complaining of pain to left ankle and left knee after jumping while playing softball several hours ago. Patient rates the pain in the ankle and severe 9/10 and exacerbated with weightbearing. She denies any numbness or paresthesia the appointment her pain is on the inferior lateral malleolus. Patient reports that she has worn a tendon similar area of injury several months ago.  History reviewed. No pertinent past medical history.  History reviewed. No pertinent past surgical history.  No family history on file.  History  Substance Use Topics  . Smoking status: Never Smoker   . Smokeless tobacco: Not on file  . Alcohol Use: No    OB History    Grav Para Term Preterm Abortions TAB SAB Ect Mult Living                  Review of Systems  Musculoskeletal: Positive for arthralgias.  All other systems reviewed and are negative.    Allergies  Review of patient's allergies indicates no known allergies.  Home Medications   Current Outpatient Rx  Name Route Sig Dispense Refill  . ALPRAZOLAM 0.25 MG PO TABS Oral Take 0.25 mg by mouth 3 (three) times daily as needed. For anxiety    . CELECOXIB 200 MG PO CAPS Oral Take 200 mg by mouth 2 (two) times daily.    . IBUPROFEN 200 MG PO TABS Oral Take 400 mg by mouth every 8 (eight) hours as needed. For pain    . PROPRANOLOL HCL 20 MG PO TABS Oral Take 20 mg by mouth 2 (two) times daily.     Marland Kitchen HYDROCODONE-ACETAMINOPHEN 5-500 MG PO TABS Oral Take 1-2 tablets by mouth every 6 (six) hours as needed for pain. 15 tablet 0  . ONDANSETRON 4 MG PO TBDP Oral Take 1 tablet (4 mg total)  by mouth every 8 (eight) hours as needed for nausea. 10 tablet 0    BP 136/90  Pulse 88  Temp 98.1 F (36.7 C) (Oral)  Resp 24  SpO2 100%  LMP 09/27/2011  Physical Exam  Nursing note and vitals reviewed. Constitutional: She is oriented to person, place, and time. She appears well-developed and well-nourished. No distress.  HENT:  Head: Normocephalic and atraumatic.  Eyes: Conjunctivae and EOM are normal.  Neck: Normal range of motion.  Cardiovascular: Normal rate.   Pulmonary/Chest: Effort normal. She exhibits no tenderness.  Musculoskeletal: Normal range of motion.       Left Knee: No deformity, erythema or abrasions. FROM. No effusion or crepitance. Anterior and posterior drawer show no abnormal laxity. Stable to valgus and varus stress. Joint lines are non-tender. Neurovascularly intact.   Left ankle: No deformity, no swelling, erythema. She is exquisitely tender to the inferior portion of the left lateral malleolus. Neurovascularly intact distally.  Pt ambulates with antalgic gait.    Neurological: She is alert and oriented to person, place, and time.  Psychiatric: She has a normal mood and affect.    ED Course  Procedures (including critical care time)  Labs Reviewed - No data to display Dg Ankle Complete Left  10/01/2011  *  RADIOLOGY REPORT*  Clinical Data: Left ankle pain.  Softball injury.  Left knee pain.  LEFT ANKLE COMPLETE - 3+ VIEW  Comparison: None.  Findings: The left ankle mortise is congruent.  The talar dome is intact.  There is no fracture.  No effusion.  Tiny calcaneal spur.  IMPRESSION: No acute osseous abnormality.  Original Report Authenticated By: Andreas Newport, M.D.   Dg Knee Complete 4 Views Left  10/01/2011  *RADIOLOGY REPORT*  Clinical Data: Left knee pain.  Softball injury.  LEFT KNEE - COMPLETE 4+ VIEW  Comparison: None.  Findings: Anatomic alignment of the left knee.  No effusion.  No fracture.  Soft tissues appear within normal limits.   IMPRESSION: Negative.  Original Report Authenticated By: Andreas Newport, M.D.     1. Left ankle sprain   2. Arthralgia of knee, left       MDM  36 y.o. female with pain to left ankle and knee after jumping earlier today. Pain is severe 9/10 however the physical is within normal limits and x-rays show no fracture or dislocations. I will treat the ankle injury as a sprain, control pain and give her crutches. Will advise close followup by her orthopedist as this is a reinjury to the same area that was affected previously.         Wynetta Emery, PA-C 10/02/11 [phone]

## 2011-10-03 NOTE — ED Provider Notes (Signed)
Medical screening examination/treatment/procedure(s) were performed by non-physician practitioner and as supervising physician I was immediately available for consultation/collaboration.  Angelina Venard T Blakeley Margraf, MD 10/03/11 0854 

## 2012-02-12 ENCOUNTER — Encounter (HOSPITAL_COMMUNITY): Payer: Self-pay | Admitting: *Deleted

## 2012-02-12 ENCOUNTER — Emergency Department (HOSPITAL_COMMUNITY)
Admission: EM | Admit: 2012-02-12 | Discharge: 2012-02-12 | Disposition: A | Discharge status: Home / Self Care | Payer: 59 | Attending: Emergency Medicine | Admitting: Emergency Medicine

## 2012-02-12 ENCOUNTER — Emergency Department (HOSPITAL_COMMUNITY): Payer: 59

## 2012-02-12 DIAGNOSIS — R112 Nausea with vomiting, unspecified: Secondary | ICD-10-CM

## 2012-02-12 DIAGNOSIS — R197 Diarrhea, unspecified: Secondary | ICD-10-CM | POA: Insufficient documentation

## 2012-02-12 DIAGNOSIS — Z79899 Other long term (current) drug therapy: Secondary | ICD-10-CM | POA: Insufficient documentation

## 2012-02-12 DIAGNOSIS — R109 Unspecified abdominal pain: Secondary | ICD-10-CM | POA: Insufficient documentation

## 2012-02-12 LAB — URINE MICROSCOPIC-ADD ON

## 2012-02-12 LAB — COMPREHENSIVE METABOLIC PANEL
BUN: 8 mg/dL (ref 6–23)
Calcium: 9.4 mg/dL (ref 8.4–10.5)
Creatinine, Ser: 0.61 mg/dL (ref 0.50–1.10)
GFR calc Af Amer: >90 mL/min (ref >90)
Glucose, Bld: 88 mg/dL (ref 70–99)
Sodium: 139 mEq/L (ref 135–145)
Total Protein: 7.4 g/dL (ref 6.0–8.3)

## 2012-02-12 LAB — CBC WITH DIFFERENTIAL/PLATELET
Eosinophils Absolute: 1.8 10*3/uL — ABNORMAL HIGH (ref 0.0–0.7)
Eosinophils Relative: 10 % — ABNORMAL HIGH (ref 0–5)
Lymphs Abs: 5.3 10*3/uL — ABNORMAL HIGH (ref 0.7–4.0)
MCH: 32.4 pg (ref 26.0–34.0)
MCV: 90.2 fL (ref 78.0–100.0)
Monocytes Relative: 8 % (ref 3–12)
Platelets: 708 10*3/uL — ABNORMAL HIGH (ref 150–400)
RBC: 4.6 MIL/uL (ref 3.87–5.11)

## 2012-02-12 LAB — PREGNANCY, URINE: Preg Test, Ur: NEGATIVE

## 2012-02-12 LAB — URINALYSIS, ROUTINE W REFLEX MICROSCOPIC
Nitrite: NEGATIVE
Specific Gravity, Urine: 1.011 (ref 1.005–1.030)
Urobilinogen, UA: 0.2 mg/dL (ref 0.0–1.0)

## 2012-02-12 LAB — LACTIC ACID, PLASMA: Lactic Acid, Venous: 1.3 mmol/L (ref 0.5–2.2)

## 2012-02-12 LAB — LIPASE, BLOOD: Lipase: 19 U/L (ref 11–59)

## 2012-02-12 MED ORDER — ONDANSETRON HCL 4 MG/2ML IJ SOLN
4.0000 mg | Freq: Once | INTRAMUSCULAR | Status: AC
  Administered 2012-02-12: 4 mg via INTRAVENOUS
  Filled 2012-02-12: qty 2

## 2012-02-12 MED ORDER — SODIUM CHLORIDE 0.9 % IV BOLUS (SEPSIS)
1000.0000 mL | Freq: Once | INTRAVENOUS | Status: AC
  Administered 2012-02-12: 1000 mL via INTRAVENOUS

## 2012-02-12 MED ORDER — IOHEXOL 300 MG/ML  SOLN
100.0000 mL | Freq: Once | INTRAMUSCULAR | Status: AC | PRN
  Administered 2012-02-12: 100 mL via INTRAVENOUS

## 2012-02-12 MED ORDER — MORPHINE SULFATE 4 MG/ML IJ SOLN
4.0000 mg | Freq: Once | INTRAMUSCULAR | Status: AC
  Administered 2012-02-12: 4 mg via INTRAVENOUS
  Filled 2012-02-12: qty 1

## 2012-02-12 MED ORDER — ONDANSETRON 4 MG PO TBDP: ORAL_TABLET | ORAL | Status: DC

## 2012-02-12 MED ORDER — DICYCLOMINE HCL 20 MG PO TABS: 20.0000 mg | ORAL_TABLET | Freq: Two times a day (BID) | ORAL | Status: DC

## 2012-02-12 NOTE — ED Provider Notes (Signed)
History     CSN: [id number]  Arrival date & time 02/12/12  1410   First MD Initiated Contact with Patient 02/12/12 1852      Chief Complaint  Patient presents with  . Emesis    (Consider location/radiation/quality/duration/timing/severity/associated sxs/prior treatment) Patient is a 36 y.o. female presenting with general illness. The history is provided by the patient. No language interpreter was used.  Illness  The current episode started more than 2 weeks ago. The onset is undetermined. The problem occurs continuously. The problem has been gradually worsening. The problem is moderate. Nothing relieves the symptoms. Nothing aggravates the symptoms. Associated symptoms include abdominal pain, diarrhea, nausea and vomiting. Pertinent negatives include no fever, no constipation, no congestion, no headaches, no sore throat, no cough and no rash.    History reviewed. No pertinent past medical history.  Past Surgical History  Procedure Date  . Spleen surgery     removed when pt was 36 yo    History reviewed. No pertinent family history.  History  Substance Use Topics  . Smoking status: Never Smoker   . Smokeless tobacco: Not on file  . Alcohol Use: No    OB History    Grav Para Term Preterm Abortions TAB SAB Ect Mult Living                  Review of Systems  Constitutional: Negative for fever and chills.  HENT: Negative for congestion and sore throat.   Respiratory: Negative for cough and shortness of breath.   Cardiovascular: Negative for chest pain and leg swelling.  Gastrointestinal: Positive for nausea, vomiting, abdominal pain and diarrhea. Negative for constipation.  Genitourinary: Negative for dysuria and frequency.  Skin: Negative for color change and rash.  Neurological: Negative for dizziness and headaches.  Psychiatric/Behavioral: Negative for confusion and agitation.  All other systems reviewed and are negative.    Allergies  Review of patient's  allergies indicates no known allergies.  Home Medications   Current Outpatient Rx  Name  Route  Sig  Dispense  Refill  . ALPRAZOLAM 0.25 MG PO TABS   Oral   Take 0.25 mg by mouth 3 (three) times daily as needed. For anxiety         . CELECOXIB 200 MG PO CAPS   Oral   Take 200 mg by mouth 2 (two) times daily.         . IBUPROFEN 200 MG PO TABS   Oral   Take 400 mg by mouth every 8 (eight) hours as needed. For pain         . PROPRANOLOL HCL 20 MG PO TABS   Oral   Take 20 mg by mouth 2 (two) times daily.            BP 131/89  Pulse 110  Temp 97.4 F (36.3 C) (Oral)  Resp 18  SpO2 100%  Physical Exam  Vitals reviewed. Constitutional: She is oriented to person, place, and time. She appears well-developed and well-nourished. No distress.  HENT:  Head: Normocephalic and atraumatic.  Eyes: EOM are normal. Pupils are equal, round, and reactive to light.  Neck: Normal range of motion. Neck supple.  Cardiovascular: Normal rate and regular rhythm.   Pulmonary/Chest: Effort normal. No respiratory distress.  Abdominal: Soft. She exhibits no distension. There is tenderness in the epigastric area and left lower quadrant. There is no rigidity, no rebound, no guarding, no CVA tenderness, no tenderness at McBurney's point and negative Murphy's sign.  Musculoskeletal: Normal range of motion. She exhibits no edema.  Neurological: She is alert and oriented to person, place, and time.  Skin: Skin is warm and dry.  Psychiatric: She has a normal mood and affect. Her behavior is normal.    ED Course  Procedures (including critical care time)  Results for orders placed during the hospital encounter of 02/12/12  CBC WITH DIFFERENTIAL      Component Value Range   WBC 18.4 (*) 4.0 - 10.5 K/uL   RBC 4.60  3.87 - 5.11 MIL/uL   Hemoglobin 14.9  12.0 - 15.0 g/dL   HCT 16.1  09.6 - 04.5 %   MCV 90.2  78.0 - 100.0 fL   MCH 32.4  26.0 - 34.0 pg   MCHC 35.9  30.0 - 36.0 g/dL   RDW  40.9  81.1 - 91.4 %   Platelets 708 (*) 150 - 400 K/uL   Neutrophils Relative 53  43 - 77 %   Neutro Abs 9.8 (*) 1.7 - 7.7 K/uL   Lymphocytes Relative 29  12 - 46 %   Lymphs Abs 5.3 (*) 0.7 - 4.0 K/uL   Monocytes Relative 8  3 - 12 %   Monocytes Absolute 1.4 (*) 0.1 - 1.0 K/uL   Eosinophils Relative 10 (*) 0 - 5 %   Eosinophils Absolute 1.8 (*) 0.0 - 0.7 K/uL   Basophils Relative 0  0 - 1 %   Basophils Absolute 0.1  0.0 - 0.1 K/uL  COMPREHENSIVE METABOLIC PANEL      Component Value Range   Sodium 139  135 - 145 mEq/L   Potassium 3.2 (*) 3.5 - 5.1 mEq/L   Chloride 101  96 - 112 mEq/L   CO2 25  19 - 32 mEq/L   Glucose, Bld 88  70 - 99 mg/dL   BUN 8  6 - 23 mg/dL   Creatinine, Ser 7.82  0.50 - 1.10 mg/dL   Calcium 9.4  8.4 - 95.6 mg/dL   Total Protein 7.4  6.0 - 8.3 g/dL   Albumin 3.8  3.5 - 5.2 g/dL   AST 26  0 - 37 U/L   ALT 33  0 - 35 U/L   Alkaline Phosphatase 60  39 - 117 U/L   Total Bilirubin 2.2 (*) 0.3 - 1.2 mg/dL   GFR calc non Af Amer >90  >90 mL/min   GFR calc Af Amer >90  >90 mL/min  LIPASE, BLOOD      Component Value Range   Lipase 19  11 - 59 U/L  URINALYSIS, ROUTINE W REFLEX MICROSCOPIC      Component Value Range   Color, Urine YELLOW  YELLOW   APPearance CLOUDY (*) CLEAR   Specific Gravity, Urine 1.011  1.005 - 1.030   pH 6.5  5.0 - 8.0   Glucose, UA NEGATIVE  NEGATIVE mg/dL   Hgb urine dipstick LARGE (*) NEGATIVE   Bilirubin Urine NEGATIVE  NEGATIVE   Ketones, ur NEGATIVE  NEGATIVE mg/dL   Protein, ur NEGATIVE  NEGATIVE mg/dL   Urobilinogen, UA 0.2  0.0 - 1.0 mg/dL   Nitrite NEGATIVE  NEGATIVE   Leukocytes, UA LARGE (*) NEGATIVE  URINE MICROSCOPIC-ADD ON      Component Value Range   Squamous Epithelial / LPF MANY (*) RARE   WBC, UA TOO NUMEROUS TO COUNT  <3 WBC/hpf   RBC / HPF 0-2  <3 RBC/hpf   Bacteria, UA MANY (*) RARE  LACTIC ACID, PLASMA  Component Value Range   Lactic Acid, Venous 1.3  0.5 - 2.2 mmol/L  PREGNANCY, URINE      Component  Value Range   Preg Test, Ur NEGATIVE  NEGATIVE    CT Abdomen Pelvis W Contrast (Final result)   Result time:02/12/12 2250    Final result by Rad Results In Interface (02/12/12 22:50:54)    Narrative:   *RADIOLOGY REPORT*  Clinical Data: Left lower quadrant abdominal pain  CT ABDOMEN AND PELVIS WITH CONTRAST  Technique: Multidetector CT imaging of the abdomen and pelvis was performed following the standard protocol during bolus administration of intravenous contrast.  Contrast: OMNIPAQUE IOHEXOL 300 MG/ML SOLN  Comparison: 09/11/2011  Findings: During the lung bases appear clear. There is no pericardial or pleural effusion. Nodule in the left lower lobe measures 3 mm, image 6. Unchanged from 03/05/2010. Mild diffuse low attenuation throughout the liver parenchyma noted. No suspicious liver abnormalities identified. The gallbladder appears normal. No biliary dilatation. Normal appearance of the pancreas. The patient is status post splenectomy.  Normal appearance of the adrenal glands. The right kidney is normal. The left kidney is also normal. No hydronephrosis. Urinary bladder appears within normal limits. The uterus and the adnexal structures appear normal. The patient is status post bilateral tubal ligation.  There are no enlarged upper abdominal lymph nodes. No pelvic or inguinal adenopathy identified. No free fluid or fluid collections noted within the abdomen or pelvis.  The stomach is normal. The small bowel loops are unremarkable. No evidence for bowel obstruction. The appendix is visualized and appears normal. Normal appearance of the proximal colon. Distal colon is also unremarkable. No evidence for acute diverticulitis.  Review of the visualized bony structures is unremarkable.  IMPRESSION:  1. No acute findings. 2. Pulmonary nodule in the right lower lobe measures 3 mm and is unchanged from 03/05/2010. This is most likely benign and no further  follow-up is necessary. 3. Hepatic steatosis.   Original Report Authenticated By: Signa Kell, M.D.     No results found.   No diagnosis found.    MDM  Pt w/ PMHx of hereditary spherocytosis now w/ 1 month hx of abdominal pain. States sx started around thanksgiving. abd pain is bloating, diffuse, a/w PO intake, at times radiating to back. Admits to multiple episodes of yellow emesis per day. Difficulty tolerating PO. Also admits to multiple episodes of diarrhea/day - non bloody non mucus. Denies dysuria/polyuria/hematuria. LMP 2 days. Ago no vaginal sx. No recent travel, sick contacts or exotic food intake, no family hx of UC or crohn's. Doesn't take NSAIDS, no significant ETOH or tobacco. No hx of gallbladder or pancreatic disease. No hx of PUD/gastritis. Exam significant for tenderness in epigastric and RLQ, no rebound or guarding. No clinical peritonitis. No CVA tenderness.   Plan: concern for IBS/IBD, colitis. Diverticulitis/diverticulosis. Possible PUD/gastritis, pancreatitis, biliary colic. Based on hx and exam doubt mesenteric ischemia, SBO, perforated viscus, colangitis. Will check CT abd/pelvis, cbc, cmp, lipase, lactic acid, u/a and hcg. Will give IVF morphine and zofran.  Course: reassessed, vitals stable, NAD, abd soft and benign. CT neg for acute pathology. Lactic acid normal, CMP unremarkable, lipase neg, WBC 18K - likely inflammatory.  U/a contaminated - pt admits to new onset dysuria today - recommend repeat u/a and WBC and follow up w/ pcp on Monday. At this time does not appear to be acute surgical abdomen. Consider referral to GI if sx persist. Stable for d/c home. Will give Rx for bentyl and zofran.  given return precautions and follow up instructions.   1. Abdominal pain   2. Nausea and vomiting   3. Diarrhea    New Prescriptions   DICYCLOMINE (BENTYL) 20 MG TABLET    Take 1 tablet (20 mg total) by mouth 2 (two) times daily.   ONDANSETRON (ZOFRAN ODT) 4 MG  DISINTEGRATING TABLET    4mg  ODT q4 hours prn nausea/vomit   Lillia Carmel, MD [address] [place] [address] [phone]  Schedule an appointment as soon as possible for a visit          Audelia Hives, MD 02/13/12 [phone]

## 2012-02-12 NOTE — ED Notes (Signed)
Pt reports that she has had intermittent abdominal pain and vomiting since Thanksgiving. Reports that it has been 2 times a week.  Pt states that when she vomits it is bile.  Pt reports abdominal pain is in her upper quadrants.  Pt has been seen by her PCP for same and was told to try not eating dairy which pt reports is not helping.

## 2012-02-12 NOTE — ED Notes (Signed)
Patient transported to CT 

## 2012-02-13 LAB — URINE CULTURE

## 2012-02-13 NOTE — ED Provider Notes (Signed)
I saw and evaluated the patient, reviewed the resident's note and I agree with the findings and plan.  Ethelda Chick, MD 02/13/12 [phone]

## 2012-02-14 NOTE — ED Notes (Signed)
+   Urine Chart sent to EDP office for review. 

## 2012-02-17 NOTE — ED Notes (Signed)
Rx for Macrobid  100 mg Sig:One tablet bid x 5 days  #10  Per Pascal Lux Wingen PA-C.

## 2012-02-18 ENCOUNTER — Other Ambulatory Visit: Payer: Self-pay | Admitting: Family Medicine

## 2012-02-18 DIAGNOSIS — R109 Unspecified abdominal pain: Secondary | ICD-10-CM

## 2012-02-19 ENCOUNTER — Ambulatory Visit
Admission: RE | Admit: 2012-02-19 | Discharge: 2012-02-19 | Disposition: A | Discharge status: Home / Self Care | Payer: 59 | Source: Ambulatory Visit | Attending: Family Medicine | Admitting: Family Medicine

## 2012-02-19 DIAGNOSIS — R109 Unspecified abdominal pain: Secondary | ICD-10-CM

## 2012-02-20 ENCOUNTER — Telehealth (HOSPITAL_COMMUNITY): Payer: Self-pay | Admitting: Emergency Medicine

## 2012-02-20 NOTE — ED Notes (Signed)
Spoke w/ pt.  Informed of results and need for addl tx.  Pt informed current writer placed on Cipro -> STS by PCP.  Chart appended

## 2012-06-24 ENCOUNTER — Other Ambulatory Visit: Payer: Self-pay | Admitting: Family Medicine

## 2012-06-24 DIAGNOSIS — M25512 Pain in left shoulder: Secondary | ICD-10-CM

## 2012-06-29 ENCOUNTER — Ambulatory Visit
Admission: RE | Admit: 2012-06-29 | Discharge: 2012-06-29 | Disposition: A | Discharge status: Home / Self Care | Payer: 59 | Source: Ambulatory Visit | Attending: Family Medicine | Admitting: Family Medicine

## 2012-06-29 ENCOUNTER — Other Ambulatory Visit: Payer: 59

## 2012-06-29 DIAGNOSIS — M25512 Pain in left shoulder: Secondary | ICD-10-CM

## 2014-05-24 ENCOUNTER — Other Ambulatory Visit: Payer: Self-pay | Admitting: Orthopaedic Surgery

## 2014-05-25 ENCOUNTER — Other Ambulatory Visit: Payer: Self-pay | Admitting: Orthopaedic Surgery

## 2014-05-25 DIAGNOSIS — M25531 Pain in right wrist: Secondary | ICD-10-CM

## 2014-06-12 ENCOUNTER — Other Ambulatory Visit: Payer: Self-pay

## 2014-06-12 ENCOUNTER — Other Ambulatory Visit: Payer: Self-pay | Admitting: Orthopaedic Surgery

## 2014-06-12 ENCOUNTER — Inpatient Hospital Stay: Admission: RE | Admit: 2014-06-12 | Payer: Self-pay | Source: Ambulatory Visit

## 2014-06-13 ENCOUNTER — Other Ambulatory Visit (HOSPITAL_COMMUNITY): Payer: Self-pay | Admitting: Orthopaedic Surgery

## 2014-06-13 DIAGNOSIS — I721 Aneurysm of artery of upper extremity: Secondary | ICD-10-CM

## 2014-06-14 ENCOUNTER — Ambulatory Visit (HOSPITAL_COMMUNITY): Payer: Self-pay

## 2014-06-15 ENCOUNTER — Ambulatory Visit (HOSPITAL_COMMUNITY)
Admission: RE | Admit: 2014-06-15 | Discharge: 2014-06-15 | Disposition: A | Discharge status: Home / Self Care | Payer: 59 | Source: Ambulatory Visit | Attending: Orthopaedic Surgery | Admitting: Orthopaedic Surgery

## 2014-06-15 DIAGNOSIS — R2231 Localized swelling, mass and lump, right upper limb: Secondary | ICD-10-CM | POA: Diagnosis not present

## 2014-06-15 DIAGNOSIS — I721 Aneurysm of artery of upper extremity: Secondary | ICD-10-CM

## 2014-06-15 DIAGNOSIS — M899 Disorder of bone, unspecified: Secondary | ICD-10-CM | POA: Diagnosis not present

## 2014-06-15 DIAGNOSIS — M79601 Pain in right arm: Secondary | ICD-10-CM | POA: Insufficient documentation

## 2014-06-15 MED ORDER — GADOBENATE DIMEGLUMINE 529 MG/ML IV SOLN
20.0000 mL | Freq: Once | INTRAVENOUS | Status: AC | PRN
  Administered 2014-06-15: 18 mL via INTRAVENOUS

## 2014-06-21 ENCOUNTER — Other Ambulatory Visit: Payer: Self-pay | Admitting: Orthopaedic Surgery

## 2014-06-21 DIAGNOSIS — M25512 Pain in left shoulder: Secondary | ICD-10-CM

## 2014-06-25 ENCOUNTER — Ambulatory Visit
Admission: RE | Admit: 2014-06-25 | Discharge: 2014-06-25 | Disposition: A | Discharge status: Home / Self Care | Payer: 59 | Source: Ambulatory Visit | Attending: Orthopaedic Surgery | Admitting: Orthopaedic Surgery

## 2014-06-25 DIAGNOSIS — M25512 Pain in left shoulder: Secondary | ICD-10-CM

## 2014-08-17 DIAGNOSIS — M7542 Impingement syndrome of left shoulder: Secondary | ICD-10-CM

## 2014-08-17 HISTORY — DX: Impingement syndrome of left shoulder: M75.42

## 2014-08-28 ENCOUNTER — Other Ambulatory Visit (HOSPITAL_BASED_OUTPATIENT_CLINIC_OR_DEPARTMENT_OTHER): Payer: Self-pay | Admitting: Orthopaedic Surgery

## 2014-08-30 ENCOUNTER — Encounter (HOSPITAL_BASED_OUTPATIENT_CLINIC_OR_DEPARTMENT_OTHER): Payer: Self-pay | Admitting: *Deleted

## 2014-08-30 NOTE — Pre-Procedure Instructions (Signed)
To come for CBC, diff, BP check

## 2014-09-04 ENCOUNTER — Encounter (HOSPITAL_BASED_OUTPATIENT_CLINIC_OR_DEPARTMENT_OTHER)
Admission: RE | Admit: 2014-09-04 | Discharge: 2014-09-04 | Disposition: A | Discharge status: Home / Self Care | Payer: 59 | Source: Ambulatory Visit | Attending: Orthopaedic Surgery | Admitting: Orthopaedic Surgery

## 2014-09-04 ENCOUNTER — Other Ambulatory Visit: Payer: Self-pay

## 2014-09-04 DIAGNOSIS — I10 Essential (primary) hypertension: Secondary | ICD-10-CM | POA: Diagnosis not present

## 2014-09-04 DIAGNOSIS — M65822 Other synovitis and tenosynovitis, left upper arm: Secondary | ICD-10-CM | POA: Diagnosis not present

## 2014-09-04 DIAGNOSIS — M7542 Impingement syndrome of left shoulder: Secondary | ICD-10-CM | POA: Diagnosis not present

## 2014-09-04 DIAGNOSIS — Z79899 Other long term (current) drug therapy: Secondary | ICD-10-CM | POA: Diagnosis not present

## 2014-09-04 DIAGNOSIS — Z6833 Body mass index (BMI) 33.0-33.9, adult: Secondary | ICD-10-CM | POA: Diagnosis not present

## 2014-09-04 DIAGNOSIS — M7582 Other shoulder lesions, left shoulder: Secondary | ICD-10-CM | POA: Diagnosis not present

## 2014-09-04 DIAGNOSIS — M94212 Chondromalacia, left shoulder: Secondary | ICD-10-CM | POA: Diagnosis not present

## 2014-09-04 LAB — CBC
HCT: 42 % (ref 36.0–46.0)
HEMOGLOBIN: 15.2 g/dL — AB (ref 12.0–15.0)
MCH: 33.5 pg (ref 26.0–34.0)
MCHC: 36.2 g/dL — AB (ref 30.0–36.0)
MCV: 92.5 fL (ref 78.0–100.0)
PLATELETS: 666 10*3/uL — AB (ref 150–400)
RBC: 4.54 MIL/uL (ref 3.87–5.11)
RDW: 11.8 % (ref 11.5–15.5)
WBC: 14.1 10*3/uL — ABNORMAL HIGH (ref 4.0–10.5)

## 2014-09-05 ENCOUNTER — Encounter (HOSPITAL_BASED_OUTPATIENT_CLINIC_OR_DEPARTMENT_OTHER): Payer: Self-pay

## 2014-09-05 ENCOUNTER — Ambulatory Visit (HOSPITAL_BASED_OUTPATIENT_CLINIC_OR_DEPARTMENT_OTHER): Payer: 59 | Admitting: Certified Registered"

## 2014-09-05 ENCOUNTER — Encounter (HOSPITAL_BASED_OUTPATIENT_CLINIC_OR_DEPARTMENT_OTHER)
Admission: RE | Disposition: A | Discharge status: Home / Self Care | Payer: Self-pay | Source: Ambulatory Visit | Attending: Orthopaedic Surgery

## 2014-09-05 ENCOUNTER — Ambulatory Visit (HOSPITAL_BASED_OUTPATIENT_CLINIC_OR_DEPARTMENT_OTHER)
Admission: RE | Admit: 2014-09-05 | Discharge: 2014-09-05 | Disposition: A | Discharge status: Home / Self Care | Payer: 59 | Source: Ambulatory Visit | Attending: Orthopaedic Surgery | Admitting: Orthopaedic Surgery

## 2014-09-05 DIAGNOSIS — M7542 Impingement syndrome of left shoulder: Secondary | ICD-10-CM | POA: Insufficient documentation

## 2014-09-05 DIAGNOSIS — Z6833 Body mass index (BMI) 33.0-33.9, adult: Secondary | ICD-10-CM | POA: Insufficient documentation

## 2014-09-05 DIAGNOSIS — Z79899 Other long term (current) drug therapy: Secondary | ICD-10-CM | POA: Insufficient documentation

## 2014-09-05 DIAGNOSIS — M7582 Other shoulder lesions, left shoulder: Secondary | ICD-10-CM | POA: Insufficient documentation

## 2014-09-05 DIAGNOSIS — M94212 Chondromalacia, left shoulder: Secondary | ICD-10-CM | POA: Insufficient documentation

## 2014-09-05 DIAGNOSIS — M65822 Other synovitis and tenosynovitis, left upper arm: Secondary | ICD-10-CM | POA: Insufficient documentation

## 2014-09-05 DIAGNOSIS — I10 Essential (primary) hypertension: Secondary | ICD-10-CM | POA: Insufficient documentation

## 2014-09-05 HISTORY — DX: Other complications of anesthesia, initial encounter: T88.59XA

## 2014-09-05 HISTORY — PX: SHOULDER ARTHROSCOPY WITH SUBACROMIAL DECOMPRESSION: SHX5684

## 2014-09-05 HISTORY — DX: Hereditary spherocytosis: D58.0

## 2014-09-05 HISTORY — DX: Essential (primary) hypertension: I10

## 2014-09-05 HISTORY — DX: Impingement syndrome of left shoulder: M75.42

## 2014-09-05 HISTORY — DX: Adverse effect of unspecified anesthetic, initial encounter: T41.45XA

## 2014-09-05 HISTORY — DX: Elevated white blood cell count, unspecified: D72.829

## 2014-09-05 SURGERY — SHOULDER ARTHROSCOPY WITH SUBACROMIAL DECOMPRESSION
Anesthesia: General | Site: Shoulder | Laterality: Left

## 2014-09-05 MED ORDER — PROMETHAZINE HCL 25 MG PO TABS: 25.0000 mg | ORAL_TABLET | Freq: Four times a day (QID) | ORAL | Status: DC | PRN

## 2014-09-05 MED ORDER — LIDOCAINE HCL 4 % MT SOLN
OROMUCOSAL | Status: DC | PRN
  Administered 2014-09-05: 4 mL via TOPICAL

## 2014-09-05 MED ORDER — DEXAMETHASONE SODIUM PHOSPHATE 4 MG/ML IJ SOLN
INTRAMUSCULAR | Status: DC | PRN
  Administered 2014-09-05: 10 mg via INTRAVENOUS

## 2014-09-05 MED ORDER — GLYCOPYRROLATE 0.2 MG/ML IJ SOLN: 0.2000 mg | Freq: Once | INTRAMUSCULAR | Status: DC | PRN

## 2014-09-05 MED ORDER — HYDROMORPHONE HCL 1 MG/ML IJ SOLN
INTRAMUSCULAR | Status: AC
  Filled 2014-09-05: qty 1

## 2014-09-05 MED ORDER — CEFAZOLIN SODIUM-DEXTROSE 2-3 GM-% IV SOLR
2.0000 g | INTRAVENOUS | Status: AC
  Administered 2014-09-05: 2 g via INTRAVENOUS

## 2014-09-05 MED ORDER — FENTANYL CITRATE (PF) 100 MCG/2ML IJ SOLN
50.0000 ug | INTRAMUSCULAR | Status: DC | PRN
  Administered 2014-09-05: 50 ug via INTRAVENOUS

## 2014-09-05 MED ORDER — ONDANSETRON 8 MG PO TBDP
ORAL_TABLET | ORAL | Status: AC
  Filled 2014-09-05: qty 1

## 2014-09-05 MED ORDER — MIDAZOLAM HCL 2 MG/2ML IJ SOLN
1.0000 mg | INTRAMUSCULAR | Status: DC | PRN
  Administered 2014-09-05: 1 mg via INTRAVENOUS

## 2014-09-05 MED ORDER — SUCCINYLCHOLINE CHLORIDE 20 MG/ML IJ SOLN
INTRAMUSCULAR | Status: DC | PRN
  Administered 2014-09-05: 100 mg via INTRAVENOUS

## 2014-09-05 MED ORDER — ONDANSETRON 8 MG PO TBDP
8.0000 mg | ORAL_TABLET | Freq: Once | ORAL | Status: AC
  Administered 2014-09-05: 8 mg via ORAL

## 2014-09-05 MED ORDER — CEFAZOLIN SODIUM-DEXTROSE 2-3 GM-% IV SOLR
INTRAVENOUS | Status: AC
  Filled 2014-09-05: qty 50

## 2014-09-05 MED ORDER — MEPERIDINE HCL 25 MG/ML IJ SOLN
6.2500 mg | INTRAMUSCULAR | Status: DC | PRN
Start: 2014-09-05 — End: 2014-09-05

## 2014-09-05 MED ORDER — HYDROCODONE-ACETAMINOPHEN 5-325 MG PO TABS
ORAL_TABLET | ORAL | Status: AC
  Filled 2014-09-05: qty 1

## 2014-09-05 MED ORDER — FENTANYL CITRATE (PF) 100 MCG/2ML IJ SOLN
INTRAMUSCULAR | Status: AC
  Filled 2014-09-05: qty 4

## 2014-09-05 MED ORDER — ONDANSETRON HCL 4 MG PO TABS: 4.0000 mg | ORAL_TABLET | Freq: Three times a day (TID) | ORAL | Status: DC | PRN

## 2014-09-05 MED ORDER — ONDANSETRON HCL 4 MG/2ML IJ SOLN
INTRAMUSCULAR | Status: DC | PRN
  Administered 2014-09-05: 4 mg via INTRAVENOUS

## 2014-09-05 MED ORDER — SCOPOLAMINE 1 MG/3DAYS TD PT72
1.0000 | MEDICATED_PATCH | Freq: Once | TRANSDERMAL | Status: DC | PRN
  Administered 2014-09-05: 1.5 mg via TRANSDERMAL

## 2014-09-05 MED ORDER — PROPOFOL 10 MG/ML IV BOLUS
INTRAVENOUS | Status: DC | PRN
  Administered 2014-09-05: 200 mg via INTRAVENOUS

## 2014-09-05 MED ORDER — MIDAZOLAM HCL 2 MG/2ML IJ SOLN
INTRAMUSCULAR | Status: AC
  Filled 2014-09-05: qty 2

## 2014-09-05 MED ORDER — HYDROCODONE-ACETAMINOPHEN 5-325 MG PO TABS: 1.0000 | ORAL_TABLET | Freq: Once | ORAL | Status: DC

## 2014-09-05 MED ORDER — HYDROCODONE-ACETAMINOPHEN 7.5-325 MG PO TABS: 1.0000 | ORAL_TABLET | Freq: Four times a day (QID) | ORAL | Status: DC | PRN

## 2014-09-05 MED ORDER — FENTANYL CITRATE (PF) 100 MCG/2ML IJ SOLN
INTRAMUSCULAR | Status: AC
  Filled 2014-09-05: qty 2

## 2014-09-05 MED ORDER — SCOPOLAMINE 1 MG/3DAYS TD PT72
MEDICATED_PATCH | TRANSDERMAL | Status: AC
  Filled 2014-09-05: qty 1

## 2014-09-05 MED ORDER — SODIUM CHLORIDE 0.9 % IR SOLN
Status: DC | PRN
  Administered 2014-09-05: 1200 mL

## 2014-09-05 MED ORDER — LACTATED RINGERS IV SOLN
INTRAVENOUS | Status: DC
  Administered 2014-09-05 (×3): via INTRAVENOUS

## 2014-09-05 MED ORDER — HYDROMORPHONE HCL 1 MG/ML IJ SOLN
0.2500 mg | INTRAMUSCULAR | Status: DC | PRN
  Administered 2014-09-05 (×2): 0.5 mg via INTRAVENOUS

## 2014-09-05 MED ORDER — BUPIVACAINE-EPINEPHRINE 0.25% -1:200000 IJ SOLN
INTRAMUSCULAR | Status: DC | PRN
  Administered 2014-09-05: 20 mL

## 2014-09-05 MED ORDER — MIDAZOLAM HCL 2 MG/2ML IJ SOLN: 0.5000 mg | Freq: Once | INTRAMUSCULAR | Status: DC | PRN

## 2014-09-05 MED ORDER — BUPIVACAINE-EPINEPHRINE (PF) 0.5% -1:200000 IJ SOLN
INTRAMUSCULAR | Status: DC | PRN
  Administered 2014-09-05: 30 mL via PERINEURAL

## 2014-09-05 MED ORDER — PROMETHAZINE HCL 25 MG/ML IJ SOLN: 6.2500 mg | INTRAMUSCULAR | Status: DC | PRN

## 2014-09-05 MED ORDER — ONDANSETRON HCL 8 MG PO TABS: 8.0000 mg | ORAL_TABLET | Freq: Once | ORAL | Status: DC

## 2014-09-05 SURGICAL SUPPLY — 52 items
APL SKNCLS STERI-STRIP NONHPOA (GAUZE/BANDAGES/DRESSINGS)
BENZOIN TINCTURE PRP APPL 2/3 (GAUZE/BANDAGES/DRESSINGS) IMPLANT
BLADE 4.2CUDA (BLADE) ×3 IMPLANT
BLADE CUTTER GATOR 3.5 (BLADE) IMPLANT
BLADE GREAT WHITE 4.2 (BLADE) IMPLANT
BLADE GREAT WHITE 4.2MM (BLADE)
BLADE SURG 15 STRL LF DISP TIS (BLADE) IMPLANT
BLADE SURG 15 STRL SS (BLADE)
BUR OVAL 4.0 (BURR) ×3 IMPLANT
CANNULA 5.75X71 LONG (CANNULA) IMPLANT
CANNULA TWIST IN 8.25X7CM (CANNULA) ×3 IMPLANT
CLOSURE STERI-STRIP 1/2X4 (GAUZE/BANDAGES/DRESSINGS)
CLSR STERI-STRIP ANTIMIC 1/2X4 (GAUZE/BANDAGES/DRESSINGS) IMPLANT
DRAPE INCISE IOBAN 66X45 STRL (DRAPES) ×3 IMPLANT
DRAPE STERI 35X30 U-POUCH (DRAPES) ×6 IMPLANT
DRAPE SURG 17X23 STRL (DRAPES) IMPLANT
DRAPE U 20/CS (DRAPES) ×3 IMPLANT
DRAPE U-SHAPE 47X51 STRL (DRAPES) ×6 IMPLANT
DRAPE U-SHAPE 76X120 STRL (DRAPES) ×6 IMPLANT
DRSG PAD ABDOMINAL 8X10 ST (GAUZE/BANDAGES/DRESSINGS) ×3 IMPLANT
DURAPREP 26ML APPLICATOR (WOUND CARE) ×6 IMPLANT
ELECT REM PT RETURN 9FT ADLT (ELECTROSURGICAL)
ELECTRODE REM PT RTRN 9FT ADLT (ELECTROSURGICAL) IMPLANT
GAUZE SPONGE 4X4 12PLY STRL (GAUZE/BANDAGES/DRESSINGS) ×3 IMPLANT
GAUZE XEROFORM 1X8 LF (GAUZE/BANDAGES/DRESSINGS) ×3 IMPLANT
GLOVE BIO SURGEON STRL SZ 6.5 (GLOVE) ×2 IMPLANT
GLOVE BIO SURGEONS STRL SZ 6.5 (GLOVE) ×1
GLOVE BIOGEL PI IND STRL 7.0 (GLOVE) ×2 IMPLANT
GLOVE BIOGEL PI INDICATOR 7.0 (GLOVE) ×4
GLOVE NEODERM STRL 7.5 LF PF (GLOVE) ×2 IMPLANT
GLOVE SURG NEODERM 7.5  LF PF (GLOVE) ×4
GLOVE SURG SYN 7.5  E (GLOVE) ×4
GLOVE SURG SYN 7.5 E (GLOVE) ×2 IMPLANT
GOWN STRL REIN XL XLG (GOWN DISPOSABLE) ×3 IMPLANT
GOWN STRL REUS W/ TWL LRG LVL3 (GOWN DISPOSABLE) ×1 IMPLANT
GOWN STRL REUS W/TWL LRG LVL3 (GOWN DISPOSABLE) ×3
KIT SHOULDER TRACTION (DRAPES) ×3 IMPLANT
MANIFOLD NEPTUNE II (INSTRUMENTS) ×3 IMPLANT
PACK ARTHROSCOPY DSU (CUSTOM PROCEDURE TRAY) ×3 IMPLANT
PACK BASIN DAY SURGERY FS (CUSTOM PROCEDURE TRAY) ×3 IMPLANT
SET ARTHROSCOPY TUBING (MISCELLANEOUS) ×3
SET ARTHROSCOPY TUBING LN (MISCELLANEOUS) ×1 IMPLANT
SLEEVE SCD COMPRESS KNEE MED (MISCELLANEOUS) ×3 IMPLANT
SLING ARM LRG ADULT FOAM STRAP (SOFTGOODS) ×3 IMPLANT
SUT ETHILON 3 0 PS 1 (SUTURE) ×3 IMPLANT
SYR 50ML LL SCALE MARK (SYRINGE) ×3 IMPLANT
TOWEL OR 17X24 6PK STRL BLUE (TOWEL DISPOSABLE) ×3 IMPLANT
TOWEL OR NON WOVEN STRL DISP B (DISPOSABLE) ×3 IMPLANT
TUBE CONNECTING 20'X1/4 (TUBING) ×2
TUBE CONNECTING 20X1/4 (TUBING) ×4 IMPLANT
WAND STAR VAC 90 (SURGICAL WAND) ×3 IMPLANT
WATER STERILE IRR 1000ML POUR (IV SOLUTION) ×3 IMPLANT

## 2014-09-05 NOTE — Progress Notes (Signed)
AssistedDr. Jackson with left, interscalene  block. Side rails up, monitors on throughout procedure. See vital signs in flow sheet. Tolerated Procedure well.  

## 2014-09-05 NOTE — H&P (Signed)
    PREOPERATIVE H&P  Chief Complaint: left shoulder impingement  HPI: Bonnie Gibson is a 39 y.o. female who presents for surgical treatment of left shoulder impingement.  She denies any changes in medical history.  Past Medical History  Diagnosis Date  . Spherocytosis, hereditary   . Impingement syndrome of left shoulder 08/2014  . Complication of anesthesia     hard to wake up post-op  . Hypertension     has not been taking med.; advised to resume medication as directed today (08/30/2014)  . Elevated white blood cell count     due to spherocytosis; states is usually 12,000   Past Surgical History  Procedure Laterality Date  . Tonsillectomy    . Splenectomy, total    . Wisdom tooth extraction    . Tubal ligation  04/20/2002  . Shoulder arthroscopy Right   . Finger surgery Left     thumb   History   Social History  . Marital Status: Married    Spouse Name: N/A  . Number of Children: N/A  . Years of Education: N/A   Social History Main Topics  . Smoking status: Never Smoker   . Smokeless tobacco: Never Used  . Alcohol Use: Yes     Comment: occasionally  . Drug Use: No  . Sexual Activity: Not on file   Other Topics Concern  . None   Social History Narrative   History reviewed. No pertinent family history. No Known Allergies Prior to Admission medications   Medication Sig Start Date End Date Taking? Authorizing Provider  ALPRAZolam Prudy Feeler(XANAX) 1 MG tablet Take 1 mg by mouth at bedtime as needed for anxiety.   Yes Historical Provider, MD  ibuprofen (ADVIL,MOTRIN) 200 MG tablet Take 400 mg by mouth every 8 (eight) hours as needed. For pain   Yes Historical Provider, MD  propranolol (INDERAL) 20 MG tablet Take 20 mg by mouth 2 (two) times daily.     Historical Provider, MD     Positive ROS: All other systems have been reviewed and were otherwise negative with the exception of those mentioned in the HPI and as above.  Physical Exam: General: Alert, no acute  distress Cardiovascular: No pedal edema Respiratory: No cyanosis, no use of accessory musculature GI: abdomen soft Skin: No lesions in the area of chief complaint Neurologic: Sensation intact distally Psychiatric: Patient is competent for consent with normal mood and affect Lymphatic: no lymphedema  MUSCULOSKELETAL: exam stable  Assessment: left shoulder impingement  Plan: Plan for Procedure(s): LEFT SHOULDER ARTHROSCOPY WITH SUBACROMIAL DECOMPRESSION AND POSSIBLE ROTATOR CUFF REPAIR  The risks benefits and alternatives were discussed with the patient including but not limited to the risks of nonoperative treatment, versus surgical intervention including infection, bleeding, nerve injury,  blood clots, cardiopulmonary complications, morbidity, mortality, among others, and they were willing to proceed.   Cheral AlmasXu, [place] Searles Michael, MD   09/05/2014 7:41 AM

## 2014-09-05 NOTE — Anesthesia Procedure Notes (Addendum)
Anesthesia Regional Block:  Interscalene brachial plexus block  Pre-Anesthetic Checklist: ,, timeout performed, Correct Patient, Correct Site, Correct Laterality, Correct Procedure, Correct Position, site marked, Risks and benefits discussed,  Surgical consent,  Pre-op evaluation,  At surgeon's request and post-op pain management  Laterality: Left and Upper  Prep: chloraprep       Needles:  Injection technique: Single-shot     Needle Length: 5cm 5 cm Needle Gauge: 22 and 22 G    Additional Needles:  Procedures: nerve stimulator Interscalene brachial plexus block  Nerve Stimulator or Paresthesia:  Response: forearm twitch, 0.45 mA, 0.1 ms,   Additional Responses:   Narrative:  Start time: 09/05/2014 9:33 AM End time: 09/05/2014 9:39 AM Injection made incrementally with aspirations every 5 mL.  Performed by: Personally  Anesthesiologist: Jean RosenthalJACKSON, CARSWELL  Additional Notes: Pt identified in Holding room.  Monitors applied. Working IV access confirmed. Sterile prep L neck.  #22ga PNS to forearm twitch at 0.245mA threshold.  30cc 0.5% Bupivacaine with 1:200k epi injected incrementally after negative test dose.  Patient asymptomatic, VSS, no heme aspirated, tolerated well.  Sandford Craze Jackson, MD   Procedure Name: Intubation Date/Time: 09/05/2014 10:03 AM Performed by: Gar GibbonKEETON, Niley Helbig S Pre-anesthesia Checklist: Patient identified, Emergency Drugs available, Suction available and Patient being monitored Patient Re-evaluated:Patient Re-evaluated prior to inductionOxygen Delivery Method: Circle System Utilized Preoxygenation: Pre-oxygenation with 100% oxygen Intubation Type: IV induction Ventilation: Mask ventilation without difficulty Laryngoscope Size: Miller and 2 Grade View: Grade II Tube type: Oral Number of attempts: 1 Airway Equipment and Method: Stylet and Oral airway Placement Confirmation: ETT inserted through vocal cords under direct vision,  positive ETCO2 and breath  sounds checked- equal and bilateral Secured at: 21 cm Tube secured with: Tape Dental Injury: Teeth and Oropharynx as per pre-operative assessment

## 2014-09-05 NOTE — Anesthesia Postprocedure Evaluation (Signed)
  Anesthesia Post-op Note  Patient: Bonnie Gibson  Procedure(s) Performed: Procedure(s): SHOULDER ARTHROSCOPY WITH SUBACROMIAL DECOMPRESSION (Left)  Patient Location: PACU  Anesthesia Type:GA combined with regional for post-op pain  Level of Consciousness: awake, alert , oriented and patient cooperative  Airway and Oxygen Therapy: Patient Spontanous Breathing  Post-op Pain: mild  Post-op Assessment: Post-op Vital signs reviewed, Patient's Cardiovascular Status Stable, Respiratory Function Stable, Patent Airway, Adequate PO intake and Pain level controlled, nausea improved              Post-op Vital Signs: Reviewed and stable  Last Vitals:  Filed Vitals:   09/05/14 1519  BP: 102/54  Pulse: 52  Temp: 36.6 C  Resp: 20    Complications: No apparent anesthesia complications

## 2014-09-05 NOTE — Transfer of Care (Signed)
Immediate Anesthesia Transfer of Care Note  Patient: Bonnie Gibson  Procedure(s) Performed: Procedure(s): SHOULDER ARTHROSCOPY WITH SUBACROMIAL DECOMPRESSION (Left)  Patient Location: PACU  Anesthesia Type:GA combined with regional for post-op pain  Level of Consciousness: sedated, patient cooperative and responds to stimulation  Airway & Oxygen Therapy: Patient Spontanous Breathing and Patient connected to face mask oxygen  Post-op Assessment: Report given to RN and Post -op Vital signs reviewed and stable  Post vital signs: Reviewed and stable  Last Vitals:  Filed Vitals:   09/05/14 1119  BP: 138/93  Pulse:   Resp:     Complications: No apparent anesthesia complications

## 2014-09-05 NOTE — Op Note (Signed)
   Date of surgery: 09/05/2014  Preoperative diagnosis: Left shoulder impingement syndrome, rotator cuff tendinopathy next  Postoperative diagnosis: Same  Procedure: 1. Left shoulder arthroscopy with extensive debridement of the glenoid, labrum, synovitis, articular surface of rotator cuff, subacromial bursa 2. Left shoulder arthroscopy with acromioplasty and subacromial decompression  Surgeon: Glee ArvinMichael Xu, M.D.  Anesthesia: General and regional  Estimated blood loss: Minimal  Complications: None  Condition to PACU: Stable  Indications for procedure: Ms. Bonnie Gibson is a 39 year old female who presents today for surgical treatment of the above mentioned condition after failing extensive conservative treatment. She was aware the risks benefits alternatives to surgery and she wished to proceed with surgery. The consent was signed.  Description of procedure: The patient was identified in preoperative holding area. The operative site was marked by the surgeon confirmed with the patient. The regional block was administered by the anesthesiologist. She was brought back to the operating room. She was placed supine on the table. General anesthesia was induced. She was then placed in the right lateral decubitus position and the beanbag with the left arm suspended in the fishing pole mechanism. The left upper extremity was prepped and draped in standard sterile fashion. Preoperative antibiotics were given. A timeout was performed.  The standard posterior and anterior arthroscopy portals to the shoulder were established. Once the portals were established we performed a diagnostic arthroscopy which showed some mild chondromalacia of her central glenoid. Her humeral head surface was without pathology. She did have some fraying of her anterior superior labrum. She did have an extensive amount of synovitis also she then also demonstrated some tendinopathy of her articular surface of her rotator cuff. There were no  frank tears. All of this was debrided using oscillating shaver. The biceps tendon was evaluated and demonstrated no pathology. We then moved into the subacromial space. There was extensive amount of bursal tissue. This was all debrided using oscillating shaver and [employer] wand. The anterolateral corner of the acromion was established. An acromioplasty was performed. The rotator cuff was visualized from the bursal surface and there demonstrated no tears. The tendon appeared to be completely healthy. We performed extensive debridement of the subacromial space and subacromial decompression. After this was done final pictures were taken. The portal sites were closed with interrupted nylon sutures. The patient tolerated the procedure well was x-rayed and transferred to the PACU in stable condition.  Disposition: Patient will be weightbearing as tolerated to the left upper extremity. She is to wear the shoulder sling for comfort only. She is to increase her activity as tolerated. We'll see her back in the office in 2 weeks for suture removal.  N. Glee ArvinMichael Xu, MD [facility] [phone] 11:25 AM

## 2014-09-05 NOTE — Anesthesia Preprocedure Evaluation (Addendum)
Anesthesia Evaluation  Patient identified by MRN, date of birth, ID band Patient awake    Reviewed: Allergy & Precautions, NPO status , Patient's Chart, lab work & pertinent test results  History of Anesthesia Complications (+) PONV and history of anesthetic complications  Airway Mallampati: II  TM Distance: >3 FB Neck ROM: Full    Dental  (+) Dental Advisory Given   Pulmonary neg pulmonary ROS,  breath sounds clear to auscultation        Cardiovascular hypertension, Pt. on medications and Pt. on home beta blockers - anginaRhythm:Regular Rate:Normal     Neuro/Psych negative neurological ROS     GI/Hepatic negative GI ROS, Neg liver ROS,   Endo/Other  Morbid obesity  Renal/GU negative Renal ROS     Musculoskeletal   Abdominal (+) + obese,   Peds  Hematology Hereditary spherocytosis: s/p splenectomy   Anesthesia Other Findings   Reproductive/Obstetrics LMP 2 weeks ago                            Anesthesia Physical Anesthesia Plan  ASA: II  Anesthesia Plan: General   Post-op Pain Management: GA combined w/ Regional for post-op pain   Induction: Intravenous  Airway Management Planned: Oral ETT  Additional Equipment:   Intra-op Plan:   Post-operative Plan: Extubation in OR  Informed Consent: I have reviewed the patients History and Physical, chart, labs and discussed the procedure including the risks, benefits and alternatives for the proposed anesthesia with the patient or authorized representative who has indicated his/her understanding and acceptance.   Dental advisory given  Plan Discussed with: CRNA and Surgeon  Anesthesia Plan Comments: (Plan routine monitors, GETA with interscalene block for post op analgesia)        Anesthesia Quick Evaluation

## 2014-09-05 NOTE — Discharge Instructions (Signed)
Postoperative instructions: ° °Weightbearing: as tolerated ° °Keep your dressing and/or splint clean and dry at all times.  You can remove your dressing on post-operative day #3 and change with a dry/sterile dressing or Band-Aids as needed thereafter.   ° °Incision instructions:  Do not soak your incision for 3 weeks after surgery.  If the incision gets wet, pat dry and do not scrub the incision. ° °Pain control:  You have been given a prescription to be taken as directed for post-operative pain control.  In addition, elevate the operative extremity above the heart at all times to prevent swelling and throbbing pain. ° °Take over-the-counter Colace, 100mg by mouth twice a day while taking narcotic pain medications to help prevent constipation. ° °Follow up appointments: °1) 10-14 days for suture removal and wound check. °2) Dr. Xu as scheduled. ° ° ------------------------------------------------------------------------------------------------------------- ° °After Surgery Pain Control: ° °After your surgery, post-surgical discomfort or pain is likely. This discomfort can last several days to a few weeks. At certain times of the day your discomfort may be more intense.  °Did you receive a nerve block?  °A nerve block can provide pain relief for one hour to two days after your surgery. As long as the nerve block is working, you will experience little or no sensation in the area the surgeon operated on.  °As the nerve block wears off, you will begin to experience pain or discomfort. It is very important that you begin taking your prescribed pain medication before the nerve block fully wears off. Treating your pain at the first sign of the block wearing off will ensure your pain is better controlled and more tolerable when full-sensation returns. Do not wait until the pain is intolerable, as the medicine will be less effective. It is better to treat pain in advance than to try and catch up.  °General Anesthesia:  °If  you did not receive a nerve block during your surgery, you will need to start taking your pain medication shortly after your surgery and should continue to do so as prescribed by your surgeon.  °Pain Medication:  °Most commonly we prescribe Vicodin and Percocet for post-operative pain. Both of these medications contain a combination of acetaminophen (Tylenol®) and a narcotic to help control pain.  °· It takes between 30 and 45 minutes before pain medication starts to work. It is important to take your medication before your pain level gets too intense.  °· Nausea is a common side effect of many pain medications. You will want to eat something before taking your pain medicine to help prevent nausea.  °· If you are taking a prescription pain medication that contains acetaminophen, we recommend that you do not take additional over the counter acetaminophen (Tylenol®).  °Other pain relieving options:  °· Using a cold pack to ice the affected area a few times a day (15 to 20 minutes at a time) can help to relieve pain, reduce swelling and bruising.  °· Elevation of the affected area can also help to reduce pain and swelling. ° ° ° ° °Post Anesthesia Home Care Instructions ° °Activity: °Get plenty of rest for the remainder of the day. A responsible adult should stay with you for 24 hours following the procedure.  °For the next 24 hours, DO NOT: °-Drive a car °-Operate machinery °-Drink alcoholic beverages °-Take any medication unless instructed by your physician °-Make any legal decisions or sign important papers. ° °Meals: °Start with liquid foods such as gelatin or soup.   Progress to regular foods as tolerated. Avoid greasy, spicy, heavy foods. If nausea and/or vomiting occur, drink only clear liquids until the nausea and/or vomiting subsides. Call your physician if vomiting continues. ° °Special Instructions/Symptoms: °Your throat may feel dry or sore from the anesthesia or the breathing tube placed in your throat  during surgery. If this causes discomfort, gargle with warm salt water. The discomfort should disappear within 24 hours. ° °If you had a scopolamine patch placed behind your ear for the management of post- operative nausea and/or vomiting: ° °1. The medication in the patch is effective for 72 hours, after which it should be removed.  Wrap patch in a tissue and discard in the trash. Wash hands thoroughly with soap and water. °2. You may remove the patch earlier than 72 hours if you experience unpleasant side effects which may include dry mouth, dizziness or visual disturbances. °3. Avoid touching the patch. Wash your hands with soap and water after contact with the patch. °  °Regional Anesthesia Blocks ° °1. Numbness or the inability to move the "blocked" extremity may last from 3-48 hours after placement. The length of time depends on the medication injected and your individual response to the medication. If the numbness is not going away after 48 hours, call your surgeon. ° °2. The extremity that is blocked will need to be protected until the numbness is gone and the  Strength has returned. Because you cannot feel it, you will need to take extra care to avoid injury. Because it may be weak, you may have difficulty moving it or using it. You may not know what position it is in without looking at it while the block is in effect. ° °3. For blocks in the legs and feet, returning to weight bearing and walking needs to be done carefully. You will need to wait until the numbness is entirely gone and the strength has returned. You should be able to move your leg and foot normally before you try and bear weight or walk. You will need someone to be with you when you first try to ensure you do not fall and possibly risk injury. ° °4. Bruising and tenderness at the needle site are common side effects and will resolve in a few days. ° °5. Persistent numbness or new problems with movement should be communicated to the surgeon or  the [facility] ([phone])/ [facility] ([phone]). °

## 2014-09-06 ENCOUNTER — Encounter (HOSPITAL_BASED_OUTPATIENT_CLINIC_OR_DEPARTMENT_OTHER): Payer: Self-pay | Admitting: Orthopaedic Surgery

## 2014-10-29 ENCOUNTER — Other Ambulatory Visit: Payer: Self-pay | Admitting: Obstetrics and Gynecology

## 2014-10-29 DIAGNOSIS — N6311 Unspecified lump in the right breast, upper outer quadrant: Secondary | ICD-10-CM

## 2014-11-01 ENCOUNTER — Ambulatory Visit
Admission: RE | Admit: 2014-11-01 | Discharge: 2014-11-01 | Disposition: A | Discharge status: Home / Self Care | Payer: Commercial Managed Care - HMO | Source: Ambulatory Visit | Attending: Obstetrics and Gynecology | Admitting: Obstetrics and Gynecology

## 2014-11-01 DIAGNOSIS — N6311 Unspecified lump in the right breast, upper outer quadrant: Secondary | ICD-10-CM

## 2014-12-06 ENCOUNTER — Encounter (HOSPITAL_COMMUNITY): Payer: Self-pay | Admitting: Emergency Medicine

## 2014-12-06 DIAGNOSIS — I10 Essential (primary) hypertension: Secondary | ICD-10-CM | POA: Insufficient documentation

## 2014-12-06 DIAGNOSIS — Z791 Long term (current) use of non-steroidal anti-inflammatories (NSAID): Secondary | ICD-10-CM | POA: Diagnosis not present

## 2014-12-06 DIAGNOSIS — Z3202 Encounter for pregnancy test, result negative: Secondary | ICD-10-CM | POA: Diagnosis not present

## 2014-12-06 DIAGNOSIS — R1011 Right upper quadrant pain: Secondary | ICD-10-CM | POA: Diagnosis present

## 2014-12-06 NOTE — ED Notes (Signed)
Pt. reports pain at right lateral ribcage radiating to right shoulder onset today with nausea and emesis , denies SOB , no diaphoresis .

## 2014-12-07 ENCOUNTER — Emergency Department (HOSPITAL_COMMUNITY): Payer: Commercial Managed Care - HMO

## 2014-12-07 ENCOUNTER — Emergency Department (HOSPITAL_COMMUNITY)
Admission: EM | Admit: 2014-12-07 | Discharge: 2014-12-07 | Disposition: A | Discharge status: Home / Self Care | Payer: Commercial Managed Care - HMO | Attending: Emergency Medicine | Admitting: Emergency Medicine

## 2014-12-07 DIAGNOSIS — R1011 Right upper quadrant pain: Secondary | ICD-10-CM

## 2014-12-07 LAB — URINALYSIS, ROUTINE W REFLEX MICROSCOPIC
Bilirubin Urine: NEGATIVE
Glucose, UA: NEGATIVE mg/dL
KETONES UR: NEGATIVE mg/dL
Leukocytes, UA: NEGATIVE
NITRITE: NEGATIVE
PROTEIN: NEGATIVE mg/dL
SPECIFIC GRAVITY, URINE: 1.029 (ref 1.005–1.030)
UROBILINOGEN UA: 1 mg/dL (ref 0.0–1.0)
pH: 6.5 (ref 5.0–8.0)

## 2014-12-07 LAB — HEPATIC FUNCTION PANEL
ALT: 32 U/L (ref 14–54)
AST: 30 U/L (ref 15–41)
Albumin: 3.9 g/dL (ref 3.5–5.0)
Alkaline Phosphatase: 46 U/L (ref 38–126)
BILIRUBIN DIRECT: 0.2 mg/dL (ref 0.1–0.5)
BILIRUBIN INDIRECT: 1.1 mg/dL — AB (ref 0.3–0.9)
TOTAL PROTEIN: 7 g/dL (ref 6.5–8.1)
Total Bilirubin: 1.3 mg/dL — ABNORMAL HIGH (ref 0.3–1.2)

## 2014-12-07 LAB — BASIC METABOLIC PANEL
Anion gap: 8 (ref 5–15)
BUN: 14 mg/dL (ref 6–20)
CHLORIDE: 104 mmol/L (ref 101–111)
CO2: 25 mmol/L (ref 22–32)
Calcium: 9.2 mg/dL (ref 8.9–10.3)
Creatinine, Ser: 0.7 mg/dL (ref 0.44–1.00)
GFR calc Af Amer: >60 mL/min (ref >60)
Glucose, Bld: 99 mg/dL (ref 65–99)
POTASSIUM: 3.1 mmol/L — AB (ref 3.5–5.1)
SODIUM: 137 mmol/L (ref 135–145)

## 2014-12-07 LAB — URINE MICROSCOPIC-ADD ON

## 2014-12-07 LAB — CBC
HCT: 38.8 % (ref 36.0–46.0)
Hemoglobin: 13.8 g/dL (ref 12.0–15.0)
MCH: 32.9 pg (ref 26.0–34.0)
MCHC: 35.6 g/dL (ref 30.0–36.0)
MCV: 92.6 fL (ref 78.0–100.0)
PLATELETS: 613 10*3/uL — AB (ref 150–400)
RBC: 4.19 MIL/uL (ref 3.87–5.11)
RDW: 12.2 % (ref 11.5–15.5)
WBC: 15.4 10*3/uL — ABNORMAL HIGH (ref 4.0–10.5)

## 2014-12-07 LAB — LIPASE, BLOOD: LIPASE: 31 U/L (ref 11–51)

## 2014-12-07 LAB — PREGNANCY, URINE: PREG TEST UR: NEGATIVE

## 2014-12-07 LAB — I-STAT TROPONIN, ED: Troponin i, poc: 0 ng/mL (ref 0.00–0.08)

## 2014-12-07 MED ORDER — HYDROCODONE-ACETAMINOPHEN 5-325 MG PO TABS: 1.0000 | ORAL_TABLET | ORAL | Status: DC | PRN

## 2014-12-07 MED ORDER — MORPHINE SULFATE (PF) 4 MG/ML IV SOLN
4.0000 mg | Freq: Once | INTRAVENOUS | Status: AC
  Administered 2014-12-07: 4 mg via INTRAVENOUS
  Filled 2014-12-07: qty 1

## 2014-12-07 MED ORDER — ONDANSETRON 4 MG PO TBDP: 4.0000 mg | ORAL_TABLET | Freq: Three times a day (TID) | ORAL | Status: DC | PRN

## 2014-12-07 MED ORDER — ONDANSETRON HCL 4 MG/2ML IJ SOLN
4.0000 mg | Freq: Once | INTRAMUSCULAR | Status: AC
  Administered 2014-12-07: 4 mg via INTRAVENOUS
  Filled 2014-12-07: qty 2

## 2014-12-07 MED ORDER — HYDROMORPHONE HCL 1 MG/ML IJ SOLN
0.5000 mg | Freq: Once | INTRAMUSCULAR | Status: AC
  Administered 2014-12-07: 0.5 mg via INTRAVENOUS
  Filled 2014-12-07: qty 1

## 2014-12-07 MED ORDER — SODIUM CHLORIDE 0.9 % IV BOLUS (SEPSIS)
1000.0000 mL | Freq: Once | INTRAVENOUS | Status: AC
  Administered 2014-12-07: 1000 mL via INTRAVENOUS

## 2014-12-07 NOTE — ED Provider Notes (Signed)
CSN: [id number]     Arrival date & time 12/06/14  2353 History  By signing my name below, I, Freida Busman, attest that this documentation has been prepared under the direction and in the presence of Laurence Spates, MD . Electronically Signed: Freida Busman, Scribe. 12/07/2014. 4:00 AM.  Chief Complaint  Patient presents with  . Abdominal Pain   The history is provided by the patient. No language interpreter was used.    HPI Comments:  Bonnie Gibson is a 39 y.o. female who presents to the Emergency Department complaining of gradual onset, constant,  RUQ abdominal pain since the evening of 1019/2016. She was laying in bed at onset. Her pain is worse after eating. Pt reports associated nausea, vomiting and intermittent pain in her right shoulder. She also notes her pain occasionally radiates into her back. No alleviating factors noted. She has had this pain a few times previously. She denies fever, CP, SOB,  and recent illness-cough, rhinorrhea.  Past Medical History  Diagnosis Date  . Spherocytosis, hereditary (HCC)   . Impingement syndrome of left shoulder 08/2014  . Complication of anesthesia     hard to wake up post-op  . Hypertension     has not been taking med.; advised to resume medication as directed today (08/30/2014)  . Elevated white blood cell count     due to spherocytosis; states is usually 12,000   Past Surgical History  Procedure Laterality Date  . Tonsillectomy    . Splenectomy, total    . Wisdom tooth extraction    . Tubal ligation  04/20/2002  . Shoulder arthroscopy Right   . Finger surgery Left     thumb  . Shoulder arthroscopy with subacromial decompression Left 09/05/2014    Procedure: SHOULDER ARTHROSCOPY WITH SUBACROMIAL DECOMPRESSION;  Surgeon: Tarry Kos, MD;  Location:  SURGERY CENTER;  Service: Orthopedics;  Laterality: Left;   No family history on file. Social History  Substance Use Topics  . Smoking status: Never Smoker   . Smokeless  tobacco: Never Used  . Alcohol Use: Yes     Comment: occasionally   OB History    No data available     Review of Systems  10 systems reviewed and all are negative for acute change except as noted in the HPI.  Allergies  Review of patient's allergies indicates no known allergies.  Home Medications   Prior to Admission medications   Medication Sig Start Date End Date Taking? Authorizing Provider  ALPRAZolam Prudy Feeler) 1 MG tablet Take 0.5 mg by mouth at bedtime as needed for anxiety.    Yes Historical Provider, MD  MELOXICAM PO Take 1 tablet by mouth at bedtime.   Yes Historical Provider, MD  HYDROcodone-acetaminophen (NORCO/VICODIN) 5-325 MG tablet Take 1-2 tablets by mouth every 4 (four) hours as needed for severe pain. 12/07/14   Laurence Spates, MD  ondansetron (ZOFRAN ODT) 4 MG disintegrating tablet Take 1 tablet (4 mg total) by mouth every 8 (eight) hours as needed for nausea. 12/07/14   Ambrose Finland Grahm Etsitty, MD   BP 110/75 mmHg  Pulse 64  Temp(Src) 97.9 F (36.6 C) (Oral)  Resp 18  Ht  (1.626 m)  Wt 202 lb (91.627 kg)  BMI 34.66 kg/m2  SpO2 99%  LMP 12/03/2014 Physical Exam  Constitutional: She is oriented to person, place, and time. She appears well-developed and well-nourished.  Uncomfortable appearing   HENT:  Head: Normocephalic and atraumatic.  Mouth/Throat: Oropharynx is clear  and moist.  Moist mucous membranes  Eyes: Conjunctivae are normal. Pupils are equal, round, and reactive to light.  Neck: Neck supple.  Cardiovascular: Normal rate, regular rhythm and normal heart sounds.   No murmur heard. Pulmonary/Chest: Effort normal and breath sounds normal.  Abdominal: Soft. Bowel sounds are normal. She exhibits no distension. There is tenderness.  RUQ TTP Positive murphy's sign   Musculoskeletal: She exhibits no edema.  Neurological: She is alert and oriented to person, place, and time.  Fluent speech  Skin: Skin is warm and dry.  Psychiatric: She  has a normal mood and affect. Judgment normal.  Nursing note and vitals reviewed.   ED Course  Procedures   DIAGNOSTIC STUDIES:  Oxygen Saturation is 98% on RA, normal by my interpretation.    COORDINATION OF CARE:  3:16 AM Will order abdominal US and pain meds. Discussed treatment plan with pt at bedside and pt agreed to plan.  Labs Review Labs Reviewed  BASIC METABOLIC PANEL - Abnormal; Notable for the following:    Potassium 3.1 (*)    All other components within normal limits  CBC - Abnormal; Notable for the following:    WBC 15.4 (*)    Platelets 613 (*)    All other components within normal limits  HEPATIC FUNCTION PANEL - Abnormal; Notable for the following:    Total Bilirubin 1.3 (*)    Indirect Bilirubin 1.1 (*)    All other components within normal limits  URINALYSIS, ROUTINE W REFLEX MICROSCOPIC (NOT AT [facility]) - Abnormal; Notable for the following:    APPearance CLOUDY (*)    Hgb urine dipstick MODERATE (*)    All other components within normal limits  URINE MICROSCOPIC-ADD ON - Abnormal; Notable for the following:    Bacteria, UA FEW (*)    All other components within normal limits  PREGNANCY, URINE  LIPASE, BLOOD  I-STAT TROPOININ, ED    Imaging Review Dg Chest 2 View  12/07/2014  CLINICAL DATA:  Acute onset of right flank pain.  Initial encounter. EXAM: CHEST  2 VIEW COMPARISON:  None. FINDINGS: The lungs are well-aerated and clear. There is no evidence of focal opacification, pleural effusion or pneumothorax. The heart is borderline enlarged. No acute osseous abnormalities are seen. Clips are seen at the left upper quadrant. IMPRESSION: No acute cardiopulmonary process seen.  Borderline cardiomegaly. Electronically Signed   By: Roanna RaiderJeffery  Chang M.D.   On: 12/07/2014 00:49   [place] Abdomen Complete  12/07/2014  CLINICAL DATA:  Acute onset of right upper quadrant abdominal pain and vomiting. Initial encounter. EXAM: ULTRASOUND ABDOMEN COMPLETE COMPARISON:   Abdominal ultrasound performed 02/19/2012, and CT of the abdomen and pelvis from 02/12/2012 FINDINGS: Gallbladder: No gallstones or wall thickening visualized. No sonographic Murphy sign noted. Common bile duct: Diameter: 0.6 cm, within normal limits in caliber. Liver: No focal lesion identified. Diffusely increased parenchymal echogenicity likely reflects fatty infiltration. IVC: No abnormality visualized. Pancreas: Not visualized. Spleen: Size and appearance within normal limits. Right Kidney: Length: 11.8 cm. Echogenicity within normal limits. No mass or hydronephrosis visualized. Left Kidney: Length: 12.2 cm. Echogenicity within normal limits. No mass or hydronephrosis visualized. Abdominal aorta: No aneurysm visualized. Other findings: None. IMPRESSION: 1. No acute abnormality seen within the abdomen. 2. Diffuse fatty infiltration within the liver. Electronically Signed   By: Roanna RaiderJeffery  Chang M.D.   On: 12/07/2014 05:39   I have personally reviewed and evaluated these  lab results as part of my medical decision-making.   EKG  Interpretation   Date/Time:  Friday December 07 2014 00:01:52 EDT Ventricular Rate:  78 PR Interval:  166 QRS Duration: 90 QT Interval:  368 QTC Calculation: 419 R Axis:   48 Text Interpretation:  Normal sinus rhythm Normal ECG No significant change  since last tracing Confirmed by Elya Diloreto MD, Sofya Moustafa [id number] on 12/07/2014  2:26:30 AM     Medications  sodium chloride 0.9 % bolus 1,000 mL (0 mLs Intravenous Stopped 12/07/14 0447)  ondansetron (ZOFRAN) injection 4 mg (4 mg Intravenous Given 12/07/14 0347)  morphine 4 MG/ML injection 4 mg (4 mg Intravenous Given 12/07/14 0347)  ondansetron (ZOFRAN) injection 4 mg (4 mg Intravenous Given 12/07/14 0558)  HYDROmorphone (DILAUDID) injection 0.5 mg (0.5 mg Intravenous Given 12/07/14 0558)     MDM   Final diagnoses:  RUQ pain   39 year old female who presents with right upper quadrant pain that radiated to her right  shoulder this evening associated with nausea and vomiting. Patient uncomfortable at presentation. Vital signs unremarkable. EKG without ischemic changes. She had right upper quadrant tenderness with positive Murphy sign. Obtained above labs which showed bili 1.3, WBC 15.4 which is consistent with patient's previous lab work. Chest x-ray obtained from triage was also unremarkable. Right upper quadrant ultrasound showed no acute abnormalities, diffuse fatty liver noted. On reexamination after receiving morphine and Zofran, the patient was comfortable and stated that her pain was improved. Discussed results of imaging and emphasized need for PCP follow-up. Patient already has appointment with GI scheduled in a few weeks. Provided with pain medication and nausea medication to use at home. Return precautions reviewed and patient voiced understanding. Patient discharged in satisfactory condition.  I personally performed the services described in this documentation, which was scribed in my presence. The recorded information has been reviewed and is accurate.    Laurence Spates, MD 12/07/14 [phone]

## 2014-12-07 NOTE — ED Notes (Signed)
Dr. Clarene DukeLIttle bedside.

## 2014-12-07 NOTE — Discharge Instructions (Signed)

## 2014-12-18 ENCOUNTER — Other Ambulatory Visit (HOSPITAL_COMMUNITY): Payer: Self-pay | Admitting: Gastroenterology

## 2014-12-18 DIAGNOSIS — R109 Unspecified abdominal pain: Secondary | ICD-10-CM

## 2014-12-18 DIAGNOSIS — R634 Abnormal weight loss: Secondary | ICD-10-CM

## 2014-12-27 ENCOUNTER — Encounter (HOSPITAL_COMMUNITY)
Admission: RE | Admit: 2014-12-27 | Discharge: 2014-12-27 | Disposition: A | Discharge status: Home / Self Care | Payer: Commercial Managed Care - HMO | Source: Ambulatory Visit | Attending: Gastroenterology | Admitting: Gastroenterology

## 2014-12-27 DIAGNOSIS — R634 Abnormal weight loss: Secondary | ICD-10-CM | POA: Diagnosis present

## 2014-12-27 DIAGNOSIS — R109 Unspecified abdominal pain: Secondary | ICD-10-CM | POA: Diagnosis not present

## 2014-12-27 MED ORDER — SINCALIDE 5 MCG IJ SOLR
INTRAMUSCULAR | Status: AC
  Administered 2014-12-27: 4.59 ug via INTRAVENOUS
  Filled 2014-12-27: qty 5

## 2014-12-27 MED ORDER — SINCALIDE 5 MCG IJ SOLR
0.0200 ug/kg | Freq: Once | INTRAMUSCULAR | Status: AC
  Administered 2014-12-27: 4.59 ug via INTRAVENOUS

## 2014-12-27 MED ORDER — TECHNETIUM TC 99M MEBROFENIN IV KIT: 5.0000 | PACK | Freq: Once | INTRAVENOUS | Status: DC | PRN

## 2015-03-14 ENCOUNTER — Other Ambulatory Visit: Payer: Self-pay | Admitting: Surgery

## 2015-03-16 NOTE — Pre-Procedure Instructions (Signed)
Bonnie Gibson  03/16/2015     Your procedure is scheduled on January 31.  Report to [facility] Admitting at 1:15 P.M.  Call this number if you have problems the morning of surgery:  [phone]   Remember:  Do not eat food or drink liquids after midnight.  Take these medicines the morning of surgery with A SIP OF WATER None   STOP/ Do not take Aspirin, Aleve, Naproxen, Advil, Ibuprofen, Motrin, Vitamins, Herbs, or Supplements today/ tomorrow morning   Do not wear jewelry, make-up or nail polish.  Do not wear lotions, powders, or perfumes.  You may wear deodorant.  Do not shave 48 hours prior to surgery.  Men may shave face and neck.  Do not bring valuables to the hospital.  [facility] is not responsible for any belongings or valuables.  Contacts, dentures or bridgework may not be worn into surgery.  Leave your suitcase in the car.  After surgery it may be brought to your room.  For patients admitted to the hospital, discharge time will be determined by your treatment team.  Patients discharged the day of surgery will not be allowed to drive home.   [facility] - Preparing for Surgery  Before surgery, you can play an important role.  Because skin is not sterile, your skin needs to be as free of germs as possible.  You can reduce the number of germs on you skin by washing with CHG (chlorahexidine gluconate) soap before surgery.  CHG is an antiseptic cleaner which kills germs and bonds with the skin to continue killing germs even after washing.  Please DO NOT use if you have an allergy to CHG or antibacterial soaps.  If your skin becomes reddened/irritated stop using the CHG and inform your nurse when you arrive at Short Stay.  Do not shave (including legs and underarms) for at least 48 hours prior to the first CHG shower.  You may shave your face.  Please follow these instructions carefully:   1.  Shower with CHG Soap the night before surgery and the morning of  Surgery.  2.  If you choose to wash your hair, wash your hair first as usual with your normal shampoo.  3.  After you shampoo, rinse your hair and body thoroughly to remove the shampoo.  4.  Use CHG as you would any other liquid soap.  You can apply CHG directly to the skin and wash gently with scrungie or a clean washcloth.  5.  Apply the CHG Soap to your body ONLY FROM THE NECK DOWN.  Do not use on open wounds or open sores.  Avoid contact with your eyes, ears, mouth and genitals (private parts).  Wash genitals (private parts) with your normal soap.  6.  Wash thoroughly, paying special attention to the area where your surgery will be performed.  7.  Thoroughly rinse your body with warm water from the neck down.  8.  DO NOT shower/wash with your normal soap after using and rinsing off the CHG Soap.  9.  Pat yourself dry with a clean towel.            10.  Wear clean pajamas.            11.  Place clean sheets on your bed the night of your first shower and do not sleep with pets.  Day of Surgery  Do not apply any lotions the morning of surgery.  Please wear clean  clothes to the hospital/surgery center.  Please read over the following fact sheets that you were given. Pain Booklet, Coughing and Deep Breathing and Surgical Site Infection Prevention

## 2015-03-18 ENCOUNTER — Encounter (HOSPITAL_COMMUNITY)
Admission: RE | Admit: 2015-03-18 | Discharge: 2015-03-18 | Disposition: A | Discharge status: Home / Self Care | Payer: Commercial Managed Care - HMO | Source: Ambulatory Visit | Attending: Surgery | Admitting: Surgery

## 2015-03-18 ENCOUNTER — Encounter (HOSPITAL_COMMUNITY): Payer: Self-pay

## 2015-03-18 DIAGNOSIS — Z9081 Acquired absence of spleen: Secondary | ICD-10-CM | POA: Diagnosis not present

## 2015-03-18 DIAGNOSIS — K819 Cholecystitis, unspecified: Secondary | ICD-10-CM | POA: Diagnosis not present

## 2015-03-18 DIAGNOSIS — K829 Disease of gallbladder, unspecified: Secondary | ICD-10-CM | POA: Diagnosis present

## 2015-03-18 DIAGNOSIS — D580 Hereditary spherocytosis: Secondary | ICD-10-CM | POA: Diagnosis not present

## 2015-03-18 DIAGNOSIS — I10 Essential (primary) hypertension: Secondary | ICD-10-CM | POA: Diagnosis not present

## 2015-03-18 HISTORY — DX: Headache: R51

## 2015-03-18 HISTORY — DX: Other specified postprocedural states: R11.2

## 2015-03-18 HISTORY — DX: Headache, unspecified: R51.9

## 2015-03-18 HISTORY — DX: Other specified postprocedural states: Z98.890

## 2015-03-18 LAB — HCG, SERUM, QUALITATIVE: Preg, Serum: NEGATIVE

## 2015-03-18 LAB — COMPREHENSIVE METABOLIC PANEL
ALT: 23 U/L (ref 14–54)
ANION GAP: 11 (ref 5–15)
AST: 19 U/L (ref 15–41)
Albumin: 3.9 g/dL (ref 3.5–5.0)
Alkaline Phosphatase: 48 U/L (ref 38–126)
BUN: 7 mg/dL (ref 6–20)
CHLORIDE: 106 mmol/L (ref 101–111)
CO2: 24 mmol/L (ref 22–32)
CREATININE: 0.65 mg/dL (ref 0.44–1.00)
Calcium: 9.3 mg/dL (ref 8.9–10.3)
Glucose, Bld: 96 mg/dL (ref 65–99)
POTASSIUM: 3.8 mmol/L (ref 3.5–5.1)
Sodium: 141 mmol/L (ref 135–145)
Total Bilirubin: 2.7 mg/dL — ABNORMAL HIGH (ref 0.3–1.2)
Total Protein: 6.8 g/dL (ref 6.5–8.1)

## 2015-03-18 LAB — CBC WITH DIFFERENTIAL/PLATELET
Basophils Absolute: 0.1 10*3/uL (ref 0.0–0.1)
Basophils Relative: 1 %
EOS ABS: 0.2 10*3/uL (ref 0.0–0.7)
EOS PCT: 2 %
HCT: 43.6 % (ref 36.0–46.0)
Hemoglobin: 15.4 g/dL — ABNORMAL HIGH (ref 12.0–15.0)
LYMPHS ABS: 2.8 10*3/uL (ref 0.7–4.0)
LYMPHS PCT: 26 %
MCH: 33.6 pg (ref 26.0–34.0)
MCHC: 35.3 g/dL (ref 30.0–36.0)
MCV: 95 fL (ref 78.0–100.0)
MONO ABS: 1.2 10*3/uL — AB (ref 0.1–1.0)
MONOS PCT: 11 %
Neutro Abs: 6.5 10*3/uL (ref 1.7–7.7)
Neutrophils Relative %: 60 %
PLATELETS: 598 10*3/uL — AB (ref 150–400)
RBC: 4.59 MIL/uL (ref 3.87–5.11)
RDW: 12.4 % (ref 11.5–15.5)
WBC: 10.8 10*3/uL — ABNORMAL HIGH (ref 4.0–10.5)

## 2015-03-18 MED ORDER — CEFAZOLIN SODIUM-DEXTROSE 2-3 GM-% IV SOLR
2.0000 g | INTRAVENOUS | Status: AC
  Administered 2015-03-19: 2 g via INTRAVENOUS
  Filled 2015-03-18: qty 50

## 2015-03-18 MED ORDER — CHLORHEXIDINE GLUCONATE 4 % EX LIQD: 1.0000 "application " | Freq: Once | CUTANEOUS | Status: DC

## 2015-03-18 NOTE — H&P (Signed)
Bonnie Gibson. Bonnie Gibson  Location: [facility] Surgery Patient #: [id number] DOB: 12/21/1975 Married / Language: English / Race: White Female   History of Present Illness   The patient is a 40 year old female who presents with a complaint of gall bladder disease.   Her PCP is Dr. Adelene Amas.  She comes with her mother, Bonnie Gibson.   The patient had vague abdominal symptoms about 4 years ago. She changed her diet and tried some medication which seemed to help for a couple of years.  Then around September 2016, she developed recurrent right upper quadrant pain. Anything she eats makes her sick. She has been to the emergency room or to an urgent clinic several times for upper abdominal pain. It sounds like some of her symptoms were thought to be respiratory, and she has been tried on steroid and antibiotcs, but these have not helped. She went to the ER on 12/07/2014. She said saw Dr. Adelene Amas in his office on 21 February 2015. He started her on hyoscyamine which seemed to work for about a week, then her pain began again.  She said that she lost from 205 pounds in Sept to 189 pounds in Nov., 2016. Her weight is up to 195 today.  She says that anything she eats causes pain and discomfort. She is eating only 1 time a day. She has not had any vomiting. She has not noticed that she jaundiced, though she said her bilirubin was up. She does have loose stools often when she eats.  She had a negative HIDA scan on 12/27/2014. [place] on 12/07/2014 showed no gall stones. She has seen Dr. Ewing Schlein. I do not have notes from his office. She has undergone an endoscopy and a colonosocopy, both of which are reported as normal.  She denies known stomach, liver, pancreas, or colon disease. She had a splenectomy by Dr. Levie Heritage in 1984. She had a splenectomy because of the size of her spleen. She's also had a tubal ligation. Her family history is very strong for gall bladder disease. Both  her daughters have had their gall bladder removed. Her father has had his gall bladder removed.   I discussed with the patient the indications and risks of gall bladder surgery. She knows that she falls slightly outside the normal indications for gall bladder surgery. The primary risks of gall bladder surgery include, but are not limited to, bleeding, infection, common bile duct injury, and open surgery. There is also the risk that the patient may have continued symptoms after surgery. We discussed the typical post-operative recovery course. I tried to answer the patient's questions.  I gave the patient literature about gall bladder surgery.  With all her studies "normal" - there is a real chance that her symptoms will not improve. I put this risk as high as 30 to 40%. But there is also a chance that her symptoms will iimprove and she is ready for surgery.  Past Medical History: 1. Splenectomy - hereditary spherocytosis  Her bilirubin runs 2 to 4. 2. History of left shoulder arthroscopy - Dr. Roda Shutters - 09/05/2014  She has had both shoulders operated on 3. She has had right thumb problems 4. HTN - borderline - she is not on meds at this time.  Social History: Married. She has three children: ages 25,17, and 47 She works as a Child psychotherapist at [employer]. Her mother, Bonnie Gibson, works for [employer].   Other Problems (Bonnie Eversole, LPN; 0/45/4098 11:91 PM) Cholelithiasis High  blood pressure Other disease, cancer, significant illness  Past Surgical History (Bonnie Eversole, LPN; 05/25/8117 14:78 PM) Oral Surgery Shoulder Surgery Bilateral. Splenectomy Tonsillectomy  Diagnostic Studies History (Bonnie Eversole, LPN; 2/95/6213 08:65 PM) Colonoscopy within last year Mammogram within last year Pap Smear 1-5 years ago  Allergies (Bonnie Eversole, LPN; 7/84/6962 95:28 PM) No Known Drug Allergies01/25/2017  Medication History (Bonnie  Eversole, LPN; 05/30/2438 10:27 PM) No Current Medications Medications Reconciled  Social History (Bonnie Eversole, LPN; 2/53/6644 03:47 PM) Alcohol use Occasional alcohol use. Caffeine use Carbonated beverages, Tea. No drug use Tobacco use Never smoker.  Family History Bonnie Pilling, LPN; 06/10/9561 87:56 PM) Cancer Brother. Heart Disease Mother. Heart disease in female family member before age 38 Hypertension Father, Mother.  Pregnancy / Birth History Bonnie Pilling, LPN; 4/33/2951 88:41 PM) Age at menarche 15 years. Gravida 3 Irregular periods Maternal age 40-20 Para 3    Review of Systems (Bonnie Eversole LPN; 6/60/6301 60:10 PM) General Present- Fatigue and Weight Loss. Not Present- Appetite Loss, Chills, Fever, Night Sweats and Weight Gain. Skin Not Present- Change in Wart/Mole, Dryness, Hives, Jaundice, New Lesions, Non-Healing Wounds, Rash and Ulcer. HEENT Present- Wears glasses/contact lenses and Yellow Eyes. Not Present- Earache, Hearing Loss, Hoarseness, Nose Bleed, Oral Ulcers, Ringing in the Ears, Seasonal Allergies, Sinus Pain, Sore Throat and Visual Disturbances. Respiratory Not Present- Bloody sputum, Chronic Cough, Difficulty Breathing, Snoring and Wheezing. Breast Not Present- Breast Mass, Breast Pain, Nipple Discharge and Skin Changes. Cardiovascular Not Present- Chest Pain, Difficulty Breathing Lying Down, Leg Cramps, Palpitations, Rapid Heart Rate, Shortness of Breath and Swelling of Extremities. Gastrointestinal Present- Abdominal Pain, Bloating, Chronic diarrhea, Indigestion and Nausea. Not Present- Bloody Stool, Change in Bowel Habits, Constipation, Difficulty Swallowing, Excessive gas, Gets full quickly at meals, Hemorrhoids, Rectal Pain and Vomiting. Female Genitourinary Not Present- Frequency, Nocturia, Painful Urination, Pelvic Pain and Urgency. Musculoskeletal Not Present- Back Pain, Joint Pain, Joint Stiffness, Muscle Pain, Muscle Weakness  and Swelling of Extremities. Neurological Not Present- Decreased Memory, Fainting, Headaches, Numbness, Seizures, Tingling, Tremor, Trouble walking and Weakness. Psychiatric Not Present- Anxiety, Bipolar, Change in Sleep Pattern, Depression, Fearful and Frequent crying. Endocrine Not Present- Cold Intolerance, Excessive Hunger, Hair Changes, Heat Intolerance, Hot flashes and New Diabetes. Hematology Not Present- Easy Bruising, Excessive bleeding, Gland problems, HIV and Persistent Infections.  Vitals (Bonnie Eversole LPN; 9/32/3557 32:20 PM) 03/13/2015 12:44 PM Weight: 195.6 lb Height: 64in Body Surface Area: 1.94 m Body Mass Index: 33.57 kg/m  Temp.: 57F(Oral)  Pulse: 74 (Regular)  BP: 144/82 (Sitting, Left Arm, Standard)  Physical Exam  General: WN WF alert and generally healthy appearing. HEENT: Normal. Pupils equal.  Neck: Supple. No mass. No thyroid mass. Lymph Nodes: No supraclavicular or cervical nodes.  Lungs: Clear to auscultation and symmetric breath sounds. Heart: RRR. No murmur or rub.  Abdomen: Soft. No mass. No hernia. Normal bowel sounds.   Large left upper abdominal scar. She is sore in her RUQ, but has not guarding or rebound. Rectal: Not done.  Extremities: Good strength and ROM in upper and lower extremities.  Neurologic: Grossly intact to motor and sensory function. Psychiatric: Has normal mood and affect. Behavior is normal.  Assessment & Plan  1.  GALL BLADDER DISEASE (K82.9)  Impression: Acalculous gall bladder disease - discussed the options of continued medical care vs surgery thoroughly with the patient.   Will schedule cholecystectomy.  2.  SPHEROCYTOSIS (D58.0)  History of splenectomy around 1984 (Dr. Purvis Kilts)  3.  History of left shoulder arthroscopy -  Dr. Roda Shutters - 09/05/2014  She has had both shoulders operated on  Ovidio Kin, MD, [facility] Surgery Pager: [phone] Office phone:  [phone]

## 2015-03-19 ENCOUNTER — Encounter (HOSPITAL_COMMUNITY): Payer: Self-pay | Admitting: Certified Registered Nurse Anesthetist

## 2015-03-19 ENCOUNTER — Encounter (HOSPITAL_COMMUNITY)
Admission: RE | Disposition: A | Discharge status: Home / Self Care | Payer: Self-pay | Source: Ambulatory Visit | Attending: Surgery

## 2015-03-19 ENCOUNTER — Observation Stay (HOSPITAL_COMMUNITY)
Admission: RE | Admit: 2015-03-19 | Discharge: 2015-03-20 | Disposition: A | Discharge status: Home / Self Care | Payer: Commercial Managed Care - HMO | Source: Ambulatory Visit | Attending: Surgery | Admitting: Surgery

## 2015-03-19 ENCOUNTER — Ambulatory Visit (HOSPITAL_COMMUNITY): Payer: Commercial Managed Care - HMO | Admitting: Anesthesiology

## 2015-03-19 ENCOUNTER — Ambulatory Visit (HOSPITAL_COMMUNITY): Payer: Commercial Managed Care - HMO

## 2015-03-19 DIAGNOSIS — K829 Disease of gallbladder, unspecified: Secondary | ICD-10-CM | POA: Diagnosis present

## 2015-03-19 DIAGNOSIS — K819 Cholecystitis, unspecified: Secondary | ICD-10-CM | POA: Diagnosis not present

## 2015-03-19 DIAGNOSIS — I10 Essential (primary) hypertension: Secondary | ICD-10-CM | POA: Insufficient documentation

## 2015-03-19 DIAGNOSIS — Z9081 Acquired absence of spleen: Secondary | ICD-10-CM | POA: Insufficient documentation

## 2015-03-19 DIAGNOSIS — D580 Hereditary spherocytosis: Secondary | ICD-10-CM | POA: Insufficient documentation

## 2015-03-19 HISTORY — PX: CHOLECYSTECTOMY: SHX55

## 2015-03-19 SURGERY — LAPAROSCOPIC CHOLECYSTECTOMY WITH INTRAOPERATIVE CHOLANGIOGRAM
Anesthesia: General | Site: Abdomen

## 2015-03-19 MED ORDER — MIDAZOLAM HCL 5 MG/5ML IJ SOLN
INTRAMUSCULAR | Status: DC | PRN
  Administered 2015-03-19: 2 mg via INTRAVENOUS

## 2015-03-19 MED ORDER — ONDANSETRON HCL 4 MG/2ML IJ SOLN
INTRAMUSCULAR | Status: DC | PRN
  Administered 2015-03-19: 4 mg via INTRAVENOUS

## 2015-03-19 MED ORDER — LACTATED RINGERS IV SOLN
INTRAVENOUS | Status: DC
  Administered 2015-03-19 (×3): via INTRAVENOUS

## 2015-03-19 MED ORDER — FENTANYL CITRATE (PF) 100 MCG/2ML IJ SOLN
INTRAMUSCULAR | Status: AC
  Administered 2015-03-19: 100 ug
  Filled 2015-03-19: qty 2

## 2015-03-19 MED ORDER — ROCURONIUM BROMIDE 100 MG/10ML IV SOLN
INTRAVENOUS | Status: DC | PRN
  Administered 2015-03-19: 30 mg via INTRAVENOUS

## 2015-03-19 MED ORDER — PROMETHAZINE HCL 25 MG PO TABS: 25.0000 mg | ORAL_TABLET | Freq: Three times a day (TID) | ORAL | Status: DC | PRN

## 2015-03-19 MED ORDER — ONDANSETRON 4 MG PO TBDP
4.0000 mg | ORAL_TABLET | Freq: Four times a day (QID) | ORAL | Status: DC | PRN
  Filled 2015-03-19: qty 1

## 2015-03-19 MED ORDER — BUPIVACAINE HCL 0.25 % IJ SOLN
INTRAMUSCULAR | Status: DC | PRN
  Administered 2015-03-19: 30 mL

## 2015-03-19 MED ORDER — FENTANYL CITRATE (PF) 250 MCG/5ML IJ SOLN
INTRAMUSCULAR | Status: AC
  Filled 2015-03-19: qty 5

## 2015-03-19 MED ORDER — SUCCINYLCHOLINE CHLORIDE 20 MG/ML IJ SOLN
INTRAMUSCULAR | Status: DC | PRN
  Administered 2015-03-19: 100 mg via INTRAVENOUS

## 2015-03-19 MED ORDER — SUGAMMADEX SODIUM 500 MG/5ML IV SOLN
INTRAVENOUS | Status: AC
  Filled 2015-03-19: qty 5

## 2015-03-19 MED ORDER — MIDAZOLAM HCL 2 MG/2ML IJ SOLN
INTRAMUSCULAR | Status: AC
  Filled 2015-03-19: qty 2

## 2015-03-19 MED ORDER — ACETAMINOPHEN 10 MG/ML IV SOLN
INTRAVENOUS | Status: AC
  Filled 2015-03-19: qty 100

## 2015-03-19 MED ORDER — PROPOFOL 10 MG/ML IV BOLUS
INTRAVENOUS | Status: AC
  Filled 2015-03-19: qty 20

## 2015-03-19 MED ORDER — METOCLOPRAMIDE HCL 5 MG/ML IJ SOLN
INTRAMUSCULAR | Status: AC
  Filled 2015-03-19: qty 2

## 2015-03-19 MED ORDER — SODIUM CHLORIDE 0.9 % IR SOLN
Status: DC | PRN
  Administered 2015-03-19: 1000 mL

## 2015-03-19 MED ORDER — IBUPROFEN 200 MG PO TABS: 600.0000 mg | ORAL_TABLET | Freq: Four times a day (QID) | ORAL | Status: DC | PRN

## 2015-03-19 MED ORDER — PROMETHAZINE HCL 25 MG/ML IJ SOLN
6.2500 mg | INTRAMUSCULAR | Status: DC | PRN
  Administered 2015-03-19: 6.25 mg via INTRAVENOUS

## 2015-03-19 MED ORDER — MORPHINE SULFATE (PF) 2 MG/ML IV SOLN
1.0000 mg | INTRAVENOUS | Status: DC | PRN
  Administered 2015-03-19: 2 mg via INTRAVENOUS
  Filled 2015-03-19: qty 1

## 2015-03-19 MED ORDER — PROMETHAZINE HCL 25 MG/ML IJ SOLN
INTRAMUSCULAR | Status: AC
  Filled 2015-03-19: qty 1

## 2015-03-19 MED ORDER — KCL IN DEXTROSE-NACL 20-5-0.45 MEQ/L-%-% IV SOLN
INTRAVENOUS | Status: DC
  Administered 2015-03-19: 23:00:00 via INTRAVENOUS
  Filled 2015-03-19: qty 1000

## 2015-03-19 MED ORDER — FENTANYL CITRATE (PF) 100 MCG/2ML IJ SOLN
INTRAMUSCULAR | Status: DC | PRN
  Administered 2015-03-19: 100 ug via INTRAVENOUS
  Administered 2015-03-19 (×4): 50 ug via INTRAVENOUS

## 2015-03-19 MED ORDER — SUGAMMADEX SODIUM 200 MG/2ML IV SOLN
INTRAVENOUS | Status: DC | PRN
  Administered 2015-03-19: 100 mg via INTRAVENOUS

## 2015-03-19 MED ORDER — DIPHENHYDRAMINE HCL 50 MG/ML IJ SOLN
INTRAMUSCULAR | Status: AC
  Filled 2015-03-19: qty 1

## 2015-03-19 MED ORDER — DIPHENHYDRAMINE HCL 50 MG/ML IJ SOLN
INTRAMUSCULAR | Status: DC | PRN
  Administered 2015-03-19: 12.5 mg via INTRAVENOUS

## 2015-03-19 MED ORDER — PROMETHAZINE HCL 25 MG/ML IJ SOLN
25.0000 mg | Freq: Four times a day (QID) | INTRAMUSCULAR | Status: DC | PRN
  Filled 2015-03-19 (×2): qty 1

## 2015-03-19 MED ORDER — DEXAMETHASONE SODIUM PHOSPHATE 4 MG/ML IJ SOLN
INTRAMUSCULAR | Status: DC | PRN
  Administered 2015-03-19: 8 mg via INTRAVENOUS

## 2015-03-19 MED ORDER — ALPRAZOLAM 0.5 MG PO TABS: 0.5000 mg | ORAL_TABLET | Freq: Every evening | ORAL | Status: DC | PRN

## 2015-03-19 MED ORDER — HYDROMORPHONE HCL 1 MG/ML IJ SOLN
0.2500 mg | INTRAMUSCULAR | Status: DC | PRN
  Administered 2015-03-19: 0.25 mg via INTRAVENOUS

## 2015-03-19 MED ORDER — ACETAMINOPHEN 10 MG/ML IV SOLN
INTRAVENOUS | Status: DC | PRN
  Administered 2015-03-19: 1000 mg via INTRAVENOUS

## 2015-03-19 MED ORDER — HYDROMORPHONE HCL 1 MG/ML IJ SOLN
INTRAMUSCULAR | Status: AC
Start: 2015-03-19 — End: 2015-03-20
  Filled 2015-03-19: qty 1

## 2015-03-19 MED ORDER — HEPARIN SODIUM (PORCINE) 5000 UNIT/ML IJ SOLN
5000.0000 [IU] | Freq: Three times a day (TID) | INTRAMUSCULAR | Status: DC
  Administered 2015-03-19 – 2015-03-20 (×2): 5000 [IU] via SUBCUTANEOUS
  Filled 2015-03-19 (×2): qty 1

## 2015-03-19 MED ORDER — METOCLOPRAMIDE HCL 5 MG/ML IJ SOLN
INTRAMUSCULAR | Status: DC | PRN
  Administered 2015-03-19: 10 mg via INTRAVENOUS

## 2015-03-19 MED ORDER — HYDROCODONE-ACETAMINOPHEN 5-325 MG PO TABS
1.0000 | ORAL_TABLET | ORAL | Status: DC | PRN
  Administered 2015-03-20: 2 via ORAL
  Filled 2015-03-19 (×2): qty 2

## 2015-03-19 MED ORDER — PROPOFOL 10 MG/ML IV BOLUS
INTRAVENOUS | Status: DC | PRN
  Administered 2015-03-19: 200 mg via INTRAVENOUS

## 2015-03-19 MED ORDER — LIDOCAINE HCL (CARDIAC) 20 MG/ML IV SOLN
INTRAVENOUS | Status: DC | PRN
  Administered 2015-03-19: 70 mg via INTRAVENOUS

## 2015-03-19 MED ORDER — SCOPOLAMINE 1 MG/3DAYS TD PT72
MEDICATED_PATCH | TRANSDERMAL | Status: AC
  Administered 2015-03-19: 14:00:00
  Filled 2015-03-19: qty 1

## 2015-03-19 MED ORDER — SODIUM CHLORIDE 0.9 % IV SOLN
INTRAVENOUS | Status: DC | PRN
  Administered 2015-03-19: 17 mL

## 2015-03-19 MED ORDER — ONDANSETRON HCL 4 MG/2ML IJ SOLN
4.0000 mg | Freq: Four times a day (QID) | INTRAMUSCULAR | Status: DC | PRN
  Administered 2015-03-19 – 2015-03-20 (×2): 4 mg via INTRAVENOUS
  Filled 2015-03-19 (×2): qty 2

## 2015-03-19 MED ORDER — DEXAMETHASONE SODIUM PHOSPHATE 4 MG/ML IJ SOLN
INTRAMUSCULAR | Status: AC
  Filled 2015-03-19: qty 2

## 2015-03-19 MED ORDER — BUPIVACAINE HCL (PF) 0.25 % IJ SOLN
INTRAMUSCULAR | Status: AC
  Filled 2015-03-19: qty 30

## 2015-03-19 MED ORDER — 0.9 % SODIUM CHLORIDE (POUR BTL) OPTIME
TOPICAL | Status: DC | PRN
  Administered 2015-03-19: 1000 mL

## 2015-03-19 SURGICAL SUPPLY — 47 items
ADH SKN CLS APL DERMABOND .7 (GAUZE/BANDAGES/DRESSINGS) ×1
APPLIER CLIP 5 13 M/L LIGAMAX5 (MISCELLANEOUS)
APPLIER CLIP ROT 10 11.4 M/L (STAPLE)
APR CLP MED LRG 11.4X10 (STAPLE)
APR CLP MED LRG 5 ANG JAW (MISCELLANEOUS)
BAG SPEC RTRVL LRG 6X4 10 (ENDOMECHANICALS) ×1
BLADE SURG ROTATE 9660 (MISCELLANEOUS) IMPLANT
CANISTER SUCTION 2500CC (MISCELLANEOUS) ×3 IMPLANT
CHLORAPREP W/TINT 26ML (MISCELLANEOUS) ×3 IMPLANT
CHOLANGIOGRAM CATH TAUT (CATHETERS) ×3 IMPLANT
CLIP APPLIE 5 13 M/L LIGAMAX5 (MISCELLANEOUS) IMPLANT
CLIP APPLIE ROT 10 11.4 M/L (STAPLE) IMPLANT
COVER MAYO STAND STRL (DRAPES) ×3 IMPLANT
COVER SURGICAL LIGHT HANDLE (MISCELLANEOUS) ×3 IMPLANT
DERMABOND ADVANCED (GAUZE/BANDAGES/DRESSINGS) ×2
DERMABOND ADVANCED .7 DNX12 (GAUZE/BANDAGES/DRESSINGS) ×1 IMPLANT
DRAPE C-ARM 42X72 X-RAY (DRAPES) ×3 IMPLANT
ELECT REM PT RETURN 9FT ADLT (ELECTROSURGICAL) ×3
ELECTRODE REM PT RTRN 9FT ADLT (ELECTROSURGICAL) ×1 IMPLANT
FILTER SMOKE EVAC LAPAROSHD (FILTER) ×3 IMPLANT
GLOVE SURG SIGNA 7.5 PF LTX (GLOVE) ×3 IMPLANT
GOWN STRL REUS W/ TWL LRG LVL3 (GOWN DISPOSABLE) ×2 IMPLANT
GOWN STRL REUS W/ TWL XL LVL3 (GOWN DISPOSABLE) ×1 IMPLANT
GOWN STRL REUS W/TWL LRG LVL3 (GOWN DISPOSABLE) ×6
GOWN STRL REUS W/TWL XL LVL3 (GOWN DISPOSABLE) ×3
IV CATH 14GX2 1/4 (CATHETERS) ×3 IMPLANT
KIT BASIN OR (CUSTOM PROCEDURE TRAY) ×3 IMPLANT
KIT ROOM TURNOVER OR (KITS) ×3 IMPLANT
LIQUID BAND (GAUZE/BANDAGES/DRESSINGS) ×3 IMPLANT
NS IRRIG 1000ML POUR BTL (IV SOLUTION) ×3 IMPLANT
PAD ARMBOARD 7.5X6 YLW CONV (MISCELLANEOUS) ×3 IMPLANT
POUCH SPECIMEN RETRIEVAL 10MM (ENDOMECHANICALS) ×3 IMPLANT
SCISSORS LAP 5X35 DISP (ENDOMECHANICALS) ×3 IMPLANT
SET IRRIG TUBING LAPAROSCOPIC (IRRIGATION / IRRIGATOR) ×3 IMPLANT
SLEEVE ENDOPATH XCEL 5M (ENDOMECHANICALS) ×3 IMPLANT
SPECIMEN JAR SMALL (MISCELLANEOUS) ×3 IMPLANT
STOPCOCK 4 WAY LG BORE MALE ST (IV SETS) ×3 IMPLANT
SUT MNCRL AB 4-0 PS2 18 (SUTURE) ×3 IMPLANT
SUT MON AB 5-0 PS2 18 (SUTURE) ×3 IMPLANT
TOWEL OR 17X24 6PK STRL BLUE (TOWEL DISPOSABLE) ×3 IMPLANT
TOWEL OR 17X26 10 PK STRL BLUE (TOWEL DISPOSABLE) ×3 IMPLANT
TRAY LAPAROSCOPIC MC (CUSTOM PROCEDURE TRAY) ×3 IMPLANT
TROCAR XCEL BLUNT TIP 100MML (ENDOMECHANICALS) ×3 IMPLANT
TROCAR XCEL NON-BLD 11X100MML (ENDOMECHANICALS) IMPLANT
TROCAR XCEL NON-BLD 5MMX100MML (ENDOMECHANICALS) ×3 IMPLANT
TUBING EXTENTION W/L.L. (IV SETS) ×3 IMPLANT
TUBING INSUFFLATION (TUBING) ×3 IMPLANT

## 2015-03-19 NOTE — Anesthesia Preprocedure Evaluation (Addendum)
Anesthesia Evaluation  Patient identified by MRN, date of birth, ID band Patient awake    Reviewed: Allergy & Precautions, NPO status , Patient's Chart, lab work & pertinent test results  History of Anesthesia Complications (+) PONV  Airway Mallampati: I       Dental  (+) Teeth Intact   Pulmonary neg pulmonary ROS,    breath sounds clear to auscultation       Cardiovascular hypertension,  Rhythm:Regular Rate:Normal     Neuro/Psych  Headaches,    GI/Hepatic negative GI ROS, Neg liver ROS,   Endo/Other  negative endocrine ROS  Renal/GU negative Renal ROS     Musculoskeletal negative musculoskeletal ROS (+)   Abdominal   Peds  Hematology negative hematology ROS (+)   Anesthesia Other Findings   Reproductive/Obstetrics                            Anesthesia Physical Anesthesia Plan  ASA: II  Anesthesia Plan: General   Post-op Pain Management:    Induction: Intravenous  Airway Management Planned: Oral ETT  Additional Equipment:   Intra-op Plan:   Post-operative Plan: Extubation in OR  Informed Consent: I have reviewed the patients History and Physical, chart, labs and discussed the procedure including the risks, benefits and alternatives for the proposed anesthesia with the patient or authorized representative who has indicated his/her understanding and acceptance.   Dental advisory given  Plan Discussed with:   Anesthesia Plan Comments: (Plan triple therapy for N/V )       Anesthesia Quick Evaluation

## 2015-03-19 NOTE — Anesthesia Postprocedure Evaluation (Signed)
Anesthesia Post Note  Patient: Bonnie Gibson  Procedure(s) Performed: Procedure(s) (LRB): LAPAROSCOPIC CHOLECYSTECTOMY WITH INTRAOPERATIVE CHOLANGIOGRAM (N/A)  Patient location during evaluation: PACU Anesthesia Type: General Level of consciousness: sedated Pain management: pain level controlled Vital Signs Assessment: post-procedure vital signs reviewed and stable Respiratory status: spontaneous breathing and respiratory function stable Cardiovascular status: stable Anesthetic complications: no    Last Vitals:  Filed Vitals:   03/19/15 1745 03/19/15 1800  BP: 122/75 111/75  Pulse: 62 70  Temp:    Resp: 12 13    Last Pain: There were no vitals filed for this visit.               Dayshon Roback DANIEL

## 2015-03-19 NOTE — Transfer of Care (Signed)
Immediate Anesthesia Transfer of Care Note  Patient: Bonnie Gibson  Procedure(s) Performed: Procedure(s): LAPAROSCOPIC CHOLECYSTECTOMY WITH INTRAOPERATIVE CHOLANGIOGRAM (N/A)  Patient Location: PACU  Anesthesia Type:General  Level of Consciousness: sedated, patient cooperative and responds to stimulation  Airway & Oxygen Therapy: Patient Spontanous Breathing and Patient connected to nasal cannula oxygen  Post-op Assessment: Report given to RN and Post -op Vital signs reviewed and stable  Post vital signs: Reviewed and stable  Last Vitals:  Filed Vitals:   03/19/15 1309  BP: 137/87  Pulse: 77  Temp: 36.2 C  Resp: 20    Complications: No apparent anesthesia complications

## 2015-03-19 NOTE — Op Note (Signed)
03/19/2015  5:26 PM  PATIENT:  Bonnie Gibson, 40 y.o., female, MRN: [id number]  PREOP DIAGNOSIS:  gall bladder disease  POSTOP DIAGNOSIS:   Gall bladder disease  PROCEDURE:   Procedure(s): LAPAROSCOPIC CHOLECYSTECTOMY WITH INTRAOPERATIVE CHOLANGIOGRAM  SURGEON:   Ovidio Kin, M.D.  ASSISTANTHollice Espy, MD  ANESTHESIA:   general  Anesthesiologist: Heather Roberts, MD CRNA: Daiva Eves, CRNA; Andree Elk, CRNA  General  ASA: 2  EBL:  minimal  ml  BLOOD ADMINISTERED: none  DRAINS: none   LOCAL MEDICATIONS USED:   29 cc 1/2% marcaine  SPECIMEN:   Gall bladder  COUNTS CORRECT:  YES  INDICATIONS FOR PROCEDURE:  Bonnie Gibson is a 40 y.o. (DOB: [birth year]) white  female whose primary care physician is Lupe Carney, MD and comes for cholecystectomy.   The indications and risks of the gall bladder surgery were explained to the patient.  The risks include, but are not limited to, infection, bleeding, common bile duct injury and open surgery.  SURGERY:  The patient was taken to room #2 at [facility].  The abdomen was prepped with chloroprep.  The patient was given 2 gm Ancef at the beginning of the operation.   A time out was held and the surgical checklist run.   An infraumbilical incision was made into the abdominal cavity.  A 12 mm Hasson trocar was inserted into the abdominal cavity through the infraumbilical incision and secured with a 0 Vicryl suture.  Three additional trocars were inserted: a 10 mm trocar in the sub-xiphoid location, a 5 mm trocar in the right mid subcostal area, and a 5 mm trocar in the right lateral subcostal area.   The abdomen was explored and the liver, stomach, and bowel that could be seen were unremarkable.  I took pictures of the liver, her prior left upper quadrant surgery for the chart.   The gall bladder had some minimal adhesions.  I grabbed teh gall bladder and rotated it cephalad.  Disssection was carried down to the gall  bladder/cystic duct junction and the cystic duct isolated.  A clip was placed on the gall bladder side of the cystic duct.   An intra-operative cholangiogram was shot.   The intra-operative cholangiogram was shot using a cut off Taut catheter placed through a 14 gauge angiocath in the RUQ.  The Taut catheter was inserted in the cut cystic duct and secured with an endoclip.  A cholangiogram was shot with 16 cc of 1/2 strength Omnipaque.  Using fluoroscopy, the cholangiogram showed the flow of contrast into the common bile duct, up the hepatic radicals, and into the duodenum.  There was no mass or obstruction.  This was a normal intra-operative cholangiogram.   The Taut catheter was removed.  The cystic duct was tripley endoclipped and the cystic artery was identified and clipped.  The gall bladder was bluntly and sharpley dissected from the gall bladder bed.   After the gall bladder was removed from the liver, the gall bladder bed and Triangle of [place] were inspected.  There was no bleeding or bile leak.  The gall bladder was placed in a endocatch bag and delivered through the umbilicus.  The abdomen was irrigated with 1,000 cc saline.   The trocars were then removed.  I infiltrated 29 cc of 1/2% Marcaine into the incisions.  The umbilical port closed with a 0 Vicryl suture and the skin closed with 4-0 Monocryl.  The skin was painted with Dermabond.  The patient's sponge and needle count were correct.  The patient was transported to the RR in good condition.  Alphonsa Overall, MD, [facility] Surgery Pager: [phone] Office phone:  [phone]

## 2015-03-19 NOTE — Interval H&P Note (Signed)
History and Physical Interval Note:  03/19/2015 3:39 PM  Bonnie Gibson  has presented today for surgery, with the diagnosis of gall bladder disease  The various methods of treatment have been discussed with the patient and family.  Mother and husband, [name], at bedside.  After consideration of risks, benefits and other options for treatment, the patient has consented to  Procedure(s): LAPAROSCOPIC CHOLECYSTECTOMY WITH INTRAOPERATIVE CHOLANGIOGRAM (N/A) as a surgical intervention .  The patient's history has been reviewed, patient examined, no change in status, stable for surgery.  I have reviewed the patient's chart and labs.  Questions were answered to the patient's satisfaction.     [name] H

## 2015-03-19 NOTE — Anesthesia Procedure Notes (Signed)
Procedure Name: Intubation Date/Time: 03/19/2015 3:56 PM Performed by: Ferol Luz L Pre-anesthesia Checklist: Patient identified, Emergency Drugs available, Suction available, Patient being monitored and Timeout performed Patient Re-evaluated:Patient Re-evaluated prior to inductionOxygen Delivery Method: Circle system utilized Preoxygenation: Pre-oxygenation with 100% oxygen Intubation Type: IV induction, Cricoid Pressure applied and Rapid sequence Laryngoscope Size: Mac and 3 Grade View: Grade I Tube type: Oral Tube size: 7.0 mm Number of attempts: 1 Airway Equipment and Method: Stylet Placement Confirmation: ETT inserted through vocal cords under direct vision,  positive ETCO2 and breath sounds checked- equal and bilateral Secured at: 20 cm Tube secured with: Tape Dental Injury: Teeth and Oropharynx as per pre-operative assessment

## 2015-03-20 ENCOUNTER — Encounter (HOSPITAL_COMMUNITY): Payer: Self-pay | Admitting: Surgery

## 2015-03-20 DIAGNOSIS — K819 Cholecystitis, unspecified: Secondary | ICD-10-CM | POA: Diagnosis not present

## 2015-03-20 MED ORDER — ONDANSETRON 4 MG PO TBDP: 4.0000 mg | ORAL_TABLET | Freq: Three times a day (TID) | ORAL | Status: DC | PRN

## 2015-03-20 MED ORDER — HYDROCODONE-ACETAMINOPHEN 5-325 MG PO TABS: 1.0000 | ORAL_TABLET | Freq: Four times a day (QID) | ORAL | Status: DC | PRN

## 2015-03-20 NOTE — Discharge Instructions (Signed)
[facility] - DISCHARGE INSTRUCTIONS TO PATIENT  Return to work on:  2 to 4 weeks  Activity:  Driving - may drive in 3 or 4 days if doing well   Lifting - No lifting more than 15 pounds for one week  Wound Care:   Leave incisions dry for 2 days, then may shower  Diet:  Low fat for 4 weeks, then no limit  Follow up appointment:  Call Dr. Allene Pyo office [facility] Surgery) at [phone] for an appointment in 2 to 3 weeks  Medications and dosages:  Resume your home medications.  You have a prescription for:  Vicodin and zofran  Call Dr. Ezzard Standing or his office  [phone]) if you have:  Temperature greater than 100.4,  Persistent nausea and vomiting,  Severe uncontrolled pain,  Redness, tenderness, or signs of infection (pain, swelling, redness, odor or green/yellow discharge around the site),  Difficulty breathing, headache or visual disturbances,  Any other questions or concerns you may have after discharge.  In an emergency, call 911 or go to an Emergency Department at a nearby hospital.

## 2015-03-20 NOTE — Discharge Summary (Signed)
Physician Discharge Summary  Patient ID:  Bonnie Gibson  MRN: [id number]  DOB/AGE: 1975/10/07 40 y.o.  Admit date: 03/19/2015 Discharge date: 03/20/2015  Discharge Diagnoses:  1.  Gall bladder disease 2. SPHEROCYTOSIS (D58.0) History of splenectomy around 67 (Dr. Purvis Kilts) 3. History of left shoulder arthroscopy - Dr. Roda Shutters - 09/05/2014 She has had both shoulders operated on   Active Problems:   Gall bladder disease  Operation: Procedure(s):  LAPAROSCOPIC CHOLECYSTECTOMY WITH INTRAOPERATIVE CHOLANGIOGRAM on 03/19/2015 - D. [facility]  Discharged Condition: good  Hospital Course: Bonnie Gibson is an 40 y.o. female whose primary care physician is Lupe Carney, MD and who was admitted 03/19/2015 with a chief complaint of gall bladder disease.   She was brought to the operating room on 03/19/2015 and underwent  LAPAROSCOPIC CHOLECYSTECTOMY WITH INTRAOPERATIVE CHOLANGIOGRAM. She is one day post op.  She has done well with the surgery and is ready to go home.   The discharge instructions were reviewed with the patient.  Consults: None  Significant Diagnostic Studies: Results for orders placed or performed during the hospital encounter of 03/18/15  hCG, serum, qualitative  Result Value Ref Range   Preg, Serum NEGATIVE NEGATIVE  CBC WITH DIFFERENTIAL  Result Value Ref Range   WBC 10.8 (H) 4.0 - 10.5 K/uL   RBC 4.59 3.87 - 5.11 MIL/uL   Hemoglobin 15.4 (H) 12.0 - 15.0 g/dL   HCT 29.5 62.1 - 30.8 %   MCV 95.0 78.0 - 100.0 fL   MCH 33.6 26.0 - 34.0 pg   MCHC 35.3 30.0 - 36.0 g/dL   RDW 65.7 84.6 - 96.2 %   Platelets 598 (H) 150 - 400 K/uL   Neutrophils Relative % 60 %   Neutro Abs 6.5 1.7 - 7.7 K/uL   Lymphocytes Relative 26 %   Lymphs Abs 2.8 0.7 - 4.0 K/uL   Monocytes Relative 11 %   Monocytes Absolute 1.2 (H) 0.1 - 1.0 K/uL   Eosinophils Relative 2 %   Eosinophils Absolute 0.2 0.0 - 0.7 K/uL   Basophils Relative 1 %   Basophils Absolute 0.1 0.0 - 0.1 K/uL   Comprehensive metabolic panel  Result Value Ref Range   Sodium 141 135 - 145 mmol/L   Potassium 3.8 3.5 - 5.1 mmol/L   Chloride 106 101 - 111 mmol/L   CO2 24 22 - 32 mmol/L   Glucose, Bld 96 65 - 99 mg/dL   BUN 7 6 - 20 mg/dL   Creatinine, Ser 9.52 0.44 - 1.00 mg/dL   Calcium 9.3 8.9 - 84.1 mg/dL   Total Protein 6.8 6.5 - 8.1 g/dL   Albumin 3.9 3.5 - 5.0 g/dL   AST 19 15 - 41 U/L   ALT 23 14 - 54 U/L   Alkaline Phosphatase 48 38 - 126 U/L   Total Bilirubin 2.7 (H) 0.3 - 1.2 mg/dL   GFR calc non Af Amer >60 >60 mL/min   GFR calc Af Amer >60 >60 mL/min   Anion gap 11 5 - 15    Dg Cholangiogram Operative  03/19/2015  CLINICAL DATA:  Intraoperative cholangiogram study EXAM: INTRAOPERATIVE CHOLANGIOGRAM TECHNIQUE: Cholangiographic images from the C-arm fluoroscopic device were submitted for interpretation post-operatively. Please see the procedural report for the amount of contrast and the fluoroscopy time utilized. COMPARISON:  None. FINDINGS: Contrast fills the biliary tree and duodenum without filling defects in the common bile duct. IMPRESSION: Patent biliary tree. Electronically Signed   By: Dahlia Client.D.  On: 03/19/2015 16:48    Discharge Exam:  Filed Vitals:   03/19/15 2024 03/20/15 0613  BP: 118/70 115/62  Pulse: 77 67  Temp: 97.6 F (36.4 C) 98 F (36.7 C)  Resp: 15 17    General: WN WF who is alert and generally healthy appearing.  Lungs: Clear to auscultation and symmetric breath sounds. Heart:  RRR. No murmur or rub. Abdomen: Soft. No mass. No hernia. Normal bowel sounds. Her incisions look good.  Discharge Medications:     Medication List    TAKE these medications        ALPRAZolam 0.5 MG tablet  Commonly known as:  XANAX  Take 0.5 mg by mouth at bedtime as needed for anxiety.     HYDROcodone-acetaminophen 5-325 MG tablet  Commonly known as:  NORCO/VICODIN  Take 1-2 tablets by mouth every 6 (six) hours as needed for moderate pain.     ondansetron  4 MG disintegrating tablet  Commonly known as:  ZOFRAN-ODT  Take 1 tablet (4 mg total) by mouth every 8 (eight) hours as needed for nausea.        Disposition: 01-Home or Self Care      Discharge Instructions    Diet - low sodium heart healthy    Complete by:  As directed      Increase activity slowly    Complete by:  As directed              Return to work on:  2 to 4 weeks  Activity:  Driving - may drive in 3 or 4 days if doing well   Lifting - No lifting more than 15 pounds for one week  Wound Care:   Leave incisions dry for 2 days, then may shower  Diet:  Low fat for 4 weeks, then no limit  Follow up appointment:  Call Dr. Allene Pyo office [facility] Surgery) at [phone] for an appointment in 2 to 3 weeks  Medications and dosages:  Resume your home medications.  You have a prescription for:  Vicodin and zofran    Signed: Ovidio Kin, M.D., [facility] Surgery Office:  [phone]  03/20/2015, 8:43 AM

## 2015-03-20 NOTE — Progress Notes (Signed)
[name] to be D/C'd Home per MD order. Discussed with the patient and all questions fully answered.    Medication List    TAKE these medications        ALPRAZolam 0.5 MG tablet  Commonly known as:  XANAX  Take 0.5 mg by mouth at bedtime as needed for anxiety.     HYDROcodone-acetaminophen 5-325 MG tablet  Commonly known as:  NORCO/VICODIN  Take 1-2 tablets by mouth every 6 (six) hours as needed for moderate pain.     ondansetron 4 MG disintegrating tablet  Commonly known as:  ZOFRAN-ODT  Take 1 tablet (4 mg total) by mouth every 8 (eight) hours as needed for nausea.        VVS, Skin clean, dry and intact without evidence of skin break down, no evidence of skin tears noted.  IV catheter discontinued intact. Site without signs and symptoms of complications. Dressing and pressure applied.  An After Visit Summary was printed and given to the patient.  Patient escorted via WC, and D/C home via private auto.  Kai Levins  03/20/2015 5:23 PM

## 2015-10-22 ENCOUNTER — Emergency Department (HOSPITAL_COMMUNITY): Payer: Commercial Managed Care - HMO

## 2015-10-22 ENCOUNTER — Encounter (HOSPITAL_COMMUNITY): Payer: Self-pay | Admitting: Emergency Medicine

## 2015-10-22 ENCOUNTER — Emergency Department (HOSPITAL_COMMUNITY)
Admission: EM | Admit: 2015-10-22 | Discharge: 2015-10-22 | Disposition: A | Discharge status: Home / Self Care | Payer: Commercial Managed Care - HMO | Attending: Emergency Medicine | Admitting: Emergency Medicine

## 2015-10-22 DIAGNOSIS — R000 Tachycardia, unspecified: Secondary | ICD-10-CM | POA: Diagnosis present

## 2015-10-22 DIAGNOSIS — Z79899 Other long term (current) drug therapy: Secondary | ICD-10-CM | POA: Diagnosis not present

## 2015-10-22 DIAGNOSIS — I10 Essential (primary) hypertension: Secondary | ICD-10-CM | POA: Diagnosis not present

## 2015-10-22 DIAGNOSIS — E860 Dehydration: Secondary | ICD-10-CM

## 2015-10-22 LAB — CBC WITH DIFFERENTIAL/PLATELET
BASOS ABS: 0 10*3/uL (ref 0.0–0.1)
Basophils Relative: 0 %
Eosinophils Absolute: 0.2 10*3/uL (ref 0.0–0.7)
Eosinophils Relative: 1 %
HCT: 49.1 % — ABNORMAL HIGH (ref 36.0–46.0)
Hemoglobin: 17.7 g/dL — ABNORMAL HIGH (ref 12.0–15.0)
LYMPHS PCT: 27 %
Lymphs Abs: 4.9 10*3/uL — ABNORMAL HIGH (ref 0.7–4.0)
MCH: 33.2 pg (ref 26.0–34.0)
MCHC: 36 g/dL (ref 30.0–36.0)
MCV: 92.1 fL (ref 78.0–100.0)
MONOS PCT: 13 %
Monocytes Absolute: 2.4 10*3/uL — ABNORMAL HIGH (ref 0.1–1.0)
NEUTROS ABS: 10.7 10*3/uL — AB (ref 1.7–7.7)
Neutrophils Relative %: 59 %
PLATELETS: 736 10*3/uL — AB (ref 150–400)
RBC: 5.33 MIL/uL — ABNORMAL HIGH (ref 3.87–5.11)
RDW: 12.1 % (ref 11.5–15.5)
WBC: 18.2 10*3/uL — ABNORMAL HIGH (ref 4.0–10.5)

## 2015-10-22 LAB — COMPREHENSIVE METABOLIC PANEL
ALT: 35 U/L (ref 14–54)
ANION GAP: 12 (ref 5–15)
AST: 26 U/L (ref 15–41)
Albumin: 4.9 g/dL (ref 3.5–5.0)
Alkaline Phosphatase: 57 U/L (ref 38–126)
BUN: 20 mg/dL (ref 6–20)
CHLORIDE: 101 mmol/L (ref 101–111)
CO2: 22 mmol/L (ref 22–32)
CREATININE: 1.14 mg/dL — AB (ref 0.44–1.00)
Calcium: 10.3 mg/dL (ref 8.9–10.3)
Glucose, Bld: 85 mg/dL (ref 65–99)
Potassium: 3 mmol/L — ABNORMAL LOW (ref 3.5–5.1)
Sodium: 135 mmol/L (ref 135–145)
Total Bilirubin: 4.2 mg/dL — ABNORMAL HIGH (ref 0.3–1.2)
Total Protein: 8.5 g/dL — ABNORMAL HIGH (ref 6.5–8.1)

## 2015-10-22 MED ORDER — SODIUM CHLORIDE 0.9 % IV BOLUS (SEPSIS)
1000.0000 mL | Freq: Once | INTRAVENOUS | Status: AC
  Administered 2015-10-22: 1000 mL via INTRAVENOUS

## 2015-10-22 NOTE — ED Notes (Signed)
Luckey MD at bedside.

## 2015-10-22 NOTE — ED Notes (Signed)
Pt verbalized understanding of d/c instructions and has no further questions. Pt stable and NAD.  

## 2015-10-22 NOTE — ED Provider Notes (Signed)
[facility]-EMERGENCY DEPT Provider Note   CSN: [id number] Arrival date & time: 10/22/15  2018     History   Chief Complaint Chief Complaint  Patient presents with  . Tachycardia    HR = 140's-130's    HPI Bonnie Gibson is a 40 y.o. female.  HPI  Patient is a 40 year old female with a past medical history of gallbladder disease.  Patient comes in today having concern from urgent care with concern for tachycardia and hypertension.  Patient tearful on exam and states that she is very anxious.  Patient noted to be tachycardic and hypertensive on monitor.  Patient currently denies chest pain shortness of breath abdominal pain or any other symptoms.   Past Medical History:  Diagnosis Date  . Complication of anesthesia    hard to wake up post-op  . Elevated white blood cell count    due to spherocytosis; states is usually 12,000  . Headache   . Hypertension    has not been taking med.; advised to resume medication as directed today (08/30/2014)  . Impingement syndrome of left shoulder 08/2014  . PONV (postoperative nausea and vomiting)   . Spherocytosis, hereditary [facility])     Patient Active Problem List   Diagnosis Date Noted  . 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 CHOLECYSTECTOMY N/A 03/19/2015   Procedure: LAPAROSCOPIC CHOLECYSTECTOMY WITH INTRAOPERATIVE CHOLANGIOGRAM;  Surgeon: Ovidio Kin, MD;  Location: [facility] OR;  Service: General;  Laterality: N/A;  . FINGER SURGERY Left    thumb  . SHOULDER ARTHROSCOPY Right   . SHOULDER ARTHROSCOPY WITH SUBACROMIAL DECOMPRESSION Left 09/05/2014   Procedure: SHOULDER ARTHROSCOPY WITH SUBACROMIAL DECOMPRESSION;  Surgeon: Tarry Kos, MD;  Location: [facility];  Service: Orthopedics;  Laterality: Left;  . SPLENECTOMY, TOTAL    . TONSILLECTOMY    . TUBAL LIGATION  04/20/2002  . WISDOM TOOTH EXTRACTION      OB History    No data available       Home Medications    Prior to  Admission medications   Medication Sig Start Date End Date Taking? Authorizing Provider  ALPRAZolam Prudy Feeler) 0.5 MG tablet Take 0.5 mg by mouth at bedtime as needed for anxiety.    Historical Provider, MD  HYDROcodone-acetaminophen (NORCO/VICODIN) 5-325 MG tablet Take 1-2 tablets by mouth every 6 (six) hours as needed for moderate pain. 03/20/15   Ovidio Kin, MD  ondansetron (ZOFRAN-ODT) 4 MG disintegrating tablet Take 1 tablet (4 mg total) by mouth every 8 (eight) hours as needed for nausea. 03/20/15   Ovidio Kin, MD    Family History No family history on file.  Social History Social History  Substance Use Topics  . Smoking status: Never Smoker  . Smokeless tobacco: Never Used  . Alcohol use Yes     Comment: occasionally     Allergies   Other   Review of Systems Review of Systems  Constitutional: Positive for fatigue. Negative for chills and fever.  Respiratory: Negative for shortness of breath.   Cardiovascular: Negative for chest pain and leg swelling.  Gastrointestinal: Negative for abdominal pain.  Psychiatric/Behavioral: Positive for agitation. The patient is nervous/anxious.   All other systems reviewed and are negative.    Physical Exam Updated Vital Signs BP 134/100   Pulse 82   Temp 98 F (36.7 C) (Oral)   Resp 24   Ht 5\' 5"  (1.651 m)   Wt 85.3 kg   LMP 10/08/2015 (  Approximate)   SpO2 100%   BMI 31.28 kg/m   Physical Exam  Constitutional: She appears well-developed and well-nourished. No distress.  HENT:  Head: Normocephalic and atraumatic.  Eyes: Conjunctivae are normal.  Neck: Neck supple.  Cardiovascular: Regular rhythm, normal heart sounds and intact distal pulses.  Exam reveals no gallop and no friction rub.   No murmur heard. Pulmonary/Chest: Effort normal and breath sounds normal. No respiratory distress.  Abdominal: Soft. There is no tenderness.  Musculoskeletal: She exhibits no edema.  Neurological: She is alert.  Skin: Skin is warm and  dry.  Psychiatric: She has a normal mood and affect.  Nursing note and vitals reviewed.    ED Treatments / Results  Labs (all labs ordered are listed, but only abnormal results are displayed) Labs Reviewed  CBC WITH DIFFERENTIAL/PLATELET - Abnormal; Notable for the following:       Result Value   WBC 18.2 (*)    RBC 5.33 (*)    Hemoglobin 17.7 (*)    HCT 49.1 (*)    Platelets 736 (*)    Neutro Abs 10.7 (*)    Lymphs Abs 4.9 (*)    Monocytes Absolute 2.4 (*)    All other components within normal limits  COMPREHENSIVE METABOLIC PANEL - Abnormal; Notable for the following:    Potassium 3.0 (*)    Creatinine, Ser 1.14 (*)    Total Protein 8.5 (*)    Total Bilirubin 4.2 (*)    All other components within normal limits    EKG  EKG Interpretation  Date/Time:  Tuesday October 22 2015 20:54:22 EDT Ventricular Rate:  130 PR Interval:  146 QRS Duration: 82 QT Interval:  290 QTC Calculation: 426 R Axis:   57 Text Interpretation:  Sinus tachycardia Nonspecific ST abnormality Abnormal ECG No significant change was found except for new tachycardia Confirmed by CAMPOS  MD, Caryn BeeKEVIN ([id number]) on 10/22/2015 10:08:01 PM Also confirmed by Patria ManeAMPOS  MD, Caryn BeeKEVIN ([id number]), editor [place]  CCT, BEVERLY ([id number])  on 10/23/2015 7:20:18 AM       Radiology Dg Chest 2 View  Result Date: 10/22/2015 CLINICAL DATA:  40 year old female with tachycardia and hypertension. EXAM: CHEST  2 VIEW COMPARISON:  Chest radiograph dated 12/07/2014 FINDINGS: The heart size and mediastinal contours are within normal limits. Both lungs are clear. The visualized skeletal structures are unremarkable. IMPRESSION: No active cardiopulmonary disease. Electronically Signed   By: Elgie CollardArash  Radparvar M.D.   On: 10/22/2015 22:11    Procedures Procedures (including critical care time)  Medications Ordered in ED Medications  sodium chloride 0.9 % bolus 1,000 mL (0 mLs Intravenous Stopped 10/22/15 2326)  sodium chloride 0.9 % bolus  1,000 mL (0 mLs Intravenous Stopped 10/22/15 2326)     Initial Impression / Assessment and Plan / ED Course  I have reviewed the triage vital signs and the nursing notes.  Pertinent labs & imaging results that were available during my care of the patient were reviewed by me and considered in my medical decision making (see chart for details).  Clinical Course    Patient is a 40 year old female with a past medical history of gallbladder disease.  Patient comes in today having concern from urgent care with concern for tachycardia and hypertension.  Patient tearful on exam and states that she is very anxious.  Patient noted to be tachycardic and hypertensive on monitor.  Patient currently denies chest pain shortness of breath abdominal pain or any other symptoms.  Physical exam patient clear to  auscultation bilaterally normal S1-S2 no rubs murmurs gallops abdomen soft nontender.  We will collect EKG and basic labs and reevaluate.  Laboratory workup largely within normal limits, chest x-ray showed no acute cardiopulmonary abnormality, EKG shows no concerning findings.  Upon my reevaluation the patient, patient was no longer hypertensive or tachycardic.  Patient given 2 L fluid since arriving.  Patient states that she feels well.  Believe patient's symptoms to be secondary to dehydration.  I have given patient appropriate follow-up and return precautions.  Patient voiced understanding and is agreeable to discharge at this time.   Final Clinical Impressions(s) / ED Diagnoses   Final diagnoses:  Dehydration    New Prescriptions Discharge Medication List as of 10/22/2015 11:19 PM       Caren Griffins, MD 10/24/15 1736    Azalia Bilis, MD 10/25/15 [phone]

## 2015-10-22 NOTE — ED Triage Notes (Signed)
Pt. sent from an urgent care at [place] due to elevated heartrate / hypertension this evening , presenst with HR= 140's-130's , alert and oriented/respirations unlabored , denies chest pain . Charge nurse notified to expedite pt.'s room assignment at ER .

## 2015-11-05 ENCOUNTER — Other Ambulatory Visit: Payer: Self-pay | Admitting: Obstetrics and Gynecology

## 2015-11-06 LAB — CYTOLOGY - PAP

## 2016-02-18 DIAGNOSIS — N920 Excessive and frequent menstruation with regular cycle: Secondary | ICD-10-CM | POA: Diagnosis not present

## 2016-03-09 ENCOUNTER — Other Ambulatory Visit: Payer: Self-pay | Admitting: Obstetrics and Gynecology

## 2016-03-15 DIAGNOSIS — J069 Acute upper respiratory infection, unspecified: Secondary | ICD-10-CM | POA: Diagnosis not present

## 2016-03-15 DIAGNOSIS — R509 Fever, unspecified: Secondary | ICD-10-CM | POA: Diagnosis not present

## 2016-03-26 NOTE — Patient Instructions (Addendum)
Your procedure is scheduled on:  Thursday, Feb. 15, 2018  [facility] at:  10:45 AM  Pick up the phone at the desk and dial [phone].  Call this number if you have problems the morning of surgery: [phone].  Remember: Do NOT eat food:  After 4 AM day of surgery  Do NOT drink clear liquids after:  8:00 AM day of surgery  Take these medicines the morning of surgery with a SIP OF WATER:  Propranolol, Xanax if needed  Stop ALL herbal medications at this time  Do NOT smoke the day of surgery.  Do NOT wear jewelry (body piercing), metal hair clips/bobby pins, make-up, or nail polish. Do NOT wear lotions, powders, or perfumes.  You may wear deodorant. Do NOT shave for 48 hours prior to surgery. Do NOT bring valuables to the hospital. Contacts, dentures, or bridgework may not be worn into surgery.  Leave suitcase in car.  After surgery it may be brought to your room.  For patients admitted to the hospital, checkout time is 11:00 AM the day of discharge.  Bring a copy of your healthcare power of attorney and living will documents.  **Effective Friday, Jan. 12, 2018, Bonnie Gibson will implement no hospital visitations from children age 39 and younger due to a steady increase in flu activity in our community and hospitals. **

## 2016-03-27 ENCOUNTER — Encounter (HOSPITAL_COMMUNITY)
Admission: RE | Admit: 2016-03-27 | Discharge: 2016-03-27 | Disposition: A | Discharge status: Home / Self Care | Payer: Commercial Managed Care - HMO | Source: Ambulatory Visit | Attending: Obstetrics and Gynecology | Admitting: Obstetrics and Gynecology

## 2016-03-27 ENCOUNTER — Encounter (HOSPITAL_COMMUNITY): Payer: Self-pay

## 2016-03-27 DIAGNOSIS — Z01812 Encounter for preprocedural laboratory examination: Secondary | ICD-10-CM | POA: Insufficient documentation

## 2016-03-27 HISTORY — DX: Hereditary spherocytosis: D58.0

## 2016-03-27 HISTORY — DX: Personal history of other diseases of the digestive system: Z87.19

## 2016-03-27 HISTORY — DX: Anxiety disorder, unspecified: F41.9

## 2016-03-27 HISTORY — DX: Fatty (change of) liver, not elsewhere classified: K76.0

## 2016-03-27 HISTORY — DX: Unspecified osteoarthritis, unspecified site: M19.90

## 2016-03-27 LAB — CBC
HEMATOCRIT: 42.9 % (ref 36.0–46.0)
Hemoglobin: 15.4 g/dL — ABNORMAL HIGH (ref 12.0–15.0)
MCH: 33.2 pg (ref 26.0–34.0)
MCHC: 35.9 g/dL (ref 30.0–36.0)
MCV: 92.5 fL (ref 78.0–100.0)
PLATELETS: 768 10*3/uL — AB (ref 150–400)
RBC: 4.64 MIL/uL (ref 3.87–5.11)
RDW: 12.1 % (ref 11.5–15.5)
WBC: 13.8 10*3/uL — ABNORMAL HIGH (ref 4.0–10.5)

## 2016-03-27 LAB — BASIC METABOLIC PANEL
Anion gap: 8 (ref 5–15)
BUN: 15 mg/dL (ref 6–20)
CALCIUM: 9.5 mg/dL (ref 8.9–10.3)
CO2: 25 mmol/L (ref 22–32)
Chloride: 105 mmol/L (ref 101–111)
Creatinine, Ser: 0.66 mg/dL (ref 0.44–1.00)
GFR calc Af Amer: >60 mL/min (ref >60)
GLUCOSE: 89 mg/dL (ref 65–99)
Potassium: 4.2 mmol/L (ref 3.5–5.1)
Sodium: 138 mmol/L (ref 135–145)

## 2016-04-01 NOTE — OR Nursing (Signed)
Dr Sherron AlesK. Jackson informed of elevated WBC and platelets. OK.

## 2016-04-02 ENCOUNTER — Encounter (HOSPITAL_COMMUNITY)
Admission: AD | Disposition: A | Discharge status: Home / Self Care | Payer: Self-pay | Source: Ambulatory Visit | Attending: Obstetrics and Gynecology

## 2016-04-02 ENCOUNTER — Ambulatory Visit (HOSPITAL_COMMUNITY): Payer: 59 | Admitting: Anesthesiology

## 2016-04-02 ENCOUNTER — Encounter (HOSPITAL_COMMUNITY): Payer: Self-pay | Admitting: *Deleted

## 2016-04-02 ENCOUNTER — Inpatient Hospital Stay (HOSPITAL_COMMUNITY)
Admission: AD | Admit: 2016-04-02 | Discharge: 2016-04-04 | DRG: 743 | Disposition: A | Discharge status: Home / Self Care | Payer: 59 | Source: Ambulatory Visit | Attending: Obstetrics and Gynecology | Admitting: Obstetrics and Gynecology

## 2016-04-02 DIAGNOSIS — N920 Excessive and frequent menstruation with regular cycle: Principal | ICD-10-CM | POA: Diagnosis present

## 2016-04-02 DIAGNOSIS — G8929 Other chronic pain: Secondary | ICD-10-CM | POA: Diagnosis not present

## 2016-04-02 DIAGNOSIS — N926 Irregular menstruation, unspecified: Secondary | ICD-10-CM | POA: Diagnosis not present

## 2016-04-02 DIAGNOSIS — R102 Pelvic and perineal pain: Secondary | ICD-10-CM | POA: Diagnosis not present

## 2016-04-02 DIAGNOSIS — I10 Essential (primary) hypertension: Secondary | ICD-10-CM | POA: Diagnosis not present

## 2016-04-02 HISTORY — PX: BILATERAL SALPINGECTOMY: SHX5743

## 2016-04-02 HISTORY — PX: LAPAROSCOPIC ASSISTED VAGINAL HYSTERECTOMY: SHX5398

## 2016-04-02 LAB — PREGNANCY, URINE: Preg Test, Ur: NEGATIVE

## 2016-04-02 SURGERY — HYSTERECTOMY, VAGINAL, LAPAROSCOPY-ASSISTED
Anesthesia: General

## 2016-04-02 MED ORDER — KETOROLAC TROMETHAMINE 30 MG/ML IJ SOLN
INTRAMUSCULAR | Status: AC
  Filled 2016-04-02: qty 1

## 2016-04-02 MED ORDER — LIDOCAINE HCL (CARDIAC) 20 MG/ML IV SOLN
INTRAVENOUS | Status: DC | PRN
  Administered 2016-04-02: 50 mg via INTRAVENOUS

## 2016-04-02 MED ORDER — LIDOCAINE-EPINEPHRINE 1 %-1:100000 IJ SOLN
INTRAMUSCULAR | Status: AC
  Filled 2016-04-02: qty 1

## 2016-04-02 MED ORDER — ACETAMINOPHEN 160 MG/5ML PO SOLN: 325.0000 mg | ORAL | Status: DC | PRN

## 2016-04-02 MED ORDER — CEFAZOLIN SODIUM-DEXTROSE 2-4 GM/100ML-% IV SOLN
2.0000 g | INTRAVENOUS | Status: AC
  Administered 2016-04-02: 2 g via INTRAVENOUS

## 2016-04-02 MED ORDER — HYDROCODONE-ACETAMINOPHEN 5-325 MG PO TABS
1.0000 | ORAL_TABLET | ORAL | Status: DC | PRN
  Administered 2016-04-02 – 2016-04-03 (×2): 1 via ORAL
  Administered 2016-04-03: 2 via ORAL
  Filled 2016-04-02: qty 2
  Filled 2016-04-02 (×2): qty 1

## 2016-04-02 MED ORDER — MEPERIDINE HCL 25 MG/ML IJ SOLN: 6.2500 mg | INTRAMUSCULAR | Status: DC | PRN

## 2016-04-02 MED ORDER — SCOPOLAMINE 1 MG/3DAYS TD PT72
MEDICATED_PATCH | TRANSDERMAL | Status: AC
  Administered 2016-04-02: 1.5 mg via TRANSDERMAL
  Filled 2016-04-02: qty 1

## 2016-04-02 MED ORDER — LIDOCAINE-EPINEPHRINE 1 %-1:100000 IJ SOLN
INTRAMUSCULAR | Status: DC | PRN
  Administered 2016-04-02: 10 mL

## 2016-04-02 MED ORDER — MIDAZOLAM HCL 2 MG/2ML IJ SOLN
INTRAMUSCULAR | Status: AC
  Filled 2016-04-02: qty 2

## 2016-04-02 MED ORDER — PROPOFOL 10 MG/ML IV BOLUS
INTRAVENOUS | Status: DC | PRN
  Administered 2016-04-02: 200 mg via INTRAVENOUS

## 2016-04-02 MED ORDER — PROMETHAZINE HCL 25 MG/ML IJ SOLN
6.2500 mg | Freq: Once | INTRAMUSCULAR | Status: AC
  Administered 2016-04-02: 6.25 mg via INTRAVENOUS

## 2016-04-02 MED ORDER — LACTATED RINGERS IV SOLN
INTRAVENOUS | Status: DC
  Administered 2016-04-02 (×3): via INTRAVENOUS

## 2016-04-02 MED ORDER — HYDROMORPHONE HCL 1 MG/ML IJ SOLN
0.2000 mg | INTRAMUSCULAR | Status: DC | PRN
  Administered 2016-04-02: 0.6 mg via INTRAVENOUS
  Administered 2016-04-02 – 2016-04-03 (×3): 0.5 mg via INTRAVENOUS
  Filled 2016-04-02 (×5): qty 1

## 2016-04-02 MED ORDER — SUGAMMADEX SODIUM 200 MG/2ML IV SOLN
INTRAVENOUS | Status: DC | PRN
  Administered 2016-04-02: 200 mg via INTRAVENOUS

## 2016-04-02 MED ORDER — PROPOFOL 10 MG/ML IV BOLUS
INTRAVENOUS | Status: AC
  Filled 2016-04-02: qty 20

## 2016-04-02 MED ORDER — LACTATED RINGERS IR SOLN
Status: DC | PRN
  Administered 2016-04-02: 3000 mL

## 2016-04-02 MED ORDER — FENTANYL CITRATE (PF) 100 MCG/2ML IJ SOLN
INTRAMUSCULAR | Status: AC
  Administered 2016-04-02: 50 ug via INTRAVENOUS
  Filled 2016-04-02: qty 2

## 2016-04-02 MED ORDER — ROCURONIUM BROMIDE 100 MG/10ML IV SOLN
INTRAVENOUS | Status: DC | PRN
  Administered 2016-04-02: 40 mg via INTRAVENOUS

## 2016-04-02 MED ORDER — PROMETHAZINE HCL 25 MG/ML IJ SOLN
INTRAMUSCULAR | Status: AC
  Filled 2016-04-02: qty 1

## 2016-04-02 MED ORDER — DEXAMETHASONE SODIUM PHOSPHATE 4 MG/ML IJ SOLN
INTRAMUSCULAR | Status: AC
  Filled 2016-04-02: qty 1

## 2016-04-02 MED ORDER — MIDAZOLAM HCL 2 MG/2ML IJ SOLN
INTRAMUSCULAR | Status: DC | PRN
  Administered 2016-04-02: 2 mg via INTRAVENOUS

## 2016-04-02 MED ORDER — ACETAMINOPHEN 325 MG PO TABS: 325.0000 mg | ORAL_TABLET | ORAL | Status: DC | PRN

## 2016-04-02 MED ORDER — ONDANSETRON HCL 4 MG/2ML IJ SOLN: 4.0000 mg | Freq: Once | INTRAMUSCULAR | Status: DC | PRN

## 2016-04-02 MED ORDER — SCOPOLAMINE 1 MG/3DAYS TD PT72
1.0000 | MEDICATED_PATCH | Freq: Once | TRANSDERMAL | Status: DC
  Administered 2016-04-02: 1.5 mg via TRANSDERMAL

## 2016-04-02 MED ORDER — KETOROLAC TROMETHAMINE 30 MG/ML IJ SOLN
INTRAMUSCULAR | Status: DC | PRN
  Administered 2016-04-02: 30 mg via INTRAVENOUS

## 2016-04-02 MED ORDER — ONDANSETRON HCL 4 MG/2ML IJ SOLN
INTRAMUSCULAR | Status: AC
  Filled 2016-04-02: qty 2

## 2016-04-02 MED ORDER — OXYCODONE HCL 5 MG PO TABS: 5.0000 mg | ORAL_TABLET | Freq: Once | ORAL | Status: DC | PRN

## 2016-04-02 MED ORDER — LACTATED RINGERS IV SOLN
INTRAVENOUS | Status: DC
  Administered 2016-04-02: 21:00:00 via INTRAVENOUS

## 2016-04-02 MED ORDER — FENTANYL CITRATE (PF) 100 MCG/2ML IJ SOLN
25.0000 ug | INTRAMUSCULAR | Status: DC | PRN
  Administered 2016-04-02: 25 ug via INTRAVENOUS
  Administered 2016-04-02: 50 ug via INTRAVENOUS

## 2016-04-02 MED ORDER — FENTANYL CITRATE (PF) 250 MCG/5ML IJ SOLN
INTRAMUSCULAR | Status: AC
  Filled 2016-04-02: qty 5

## 2016-04-02 MED ORDER — SUCCINYLCHOLINE CHLORIDE 20 MG/ML IJ SOLN
INTRAMUSCULAR | Status: DC | PRN
  Administered 2016-04-02: 120 mg via INTRAVENOUS

## 2016-04-02 MED ORDER — BUPIVACAINE HCL (PF) 0.25 % IJ SOLN
INTRAMUSCULAR | Status: AC
  Filled 2016-04-02: qty 30

## 2016-04-02 MED ORDER — GLYCOPYRROLATE 0.2 MG/ML IJ SOLN
INTRAMUSCULAR | Status: DC | PRN
  Administered 2016-04-02: 0.2 mg via INTRAVENOUS

## 2016-04-02 MED ORDER — LIDOCAINE HCL (CARDIAC) 20 MG/ML IV SOLN
INTRAVENOUS | Status: AC
  Filled 2016-04-02: qty 5

## 2016-04-02 MED ORDER — ROCURONIUM BROMIDE 100 MG/10ML IV SOLN
INTRAVENOUS | Status: AC
Start: 2016-04-02 — End: 2016-04-02
  Filled 2016-04-02: qty 1

## 2016-04-02 MED ORDER — ONDANSETRON HCL 4 MG/2ML IJ SOLN
INTRAMUSCULAR | Status: DC | PRN
  Administered 2016-04-02: 4 mg via INTRAVENOUS

## 2016-04-02 MED ORDER — BUPIVACAINE HCL (PF) 0.25 % IJ SOLN
INTRAMUSCULAR | Status: DC | PRN
  Administered 2016-04-02: 3 mL

## 2016-04-02 MED ORDER — DEXAMETHASONE SODIUM PHOSPHATE 10 MG/ML IJ SOLN
INTRAMUSCULAR | Status: DC | PRN
  Administered 2016-04-02: 4 mg via INTRAVENOUS

## 2016-04-02 MED ORDER — FENTANYL CITRATE (PF) 100 MCG/2ML IJ SOLN
INTRAMUSCULAR | Status: DC | PRN
  Administered 2016-04-02 (×2): 100 ug via INTRAVENOUS
  Administered 2016-04-02: 50 ug via INTRAVENOUS

## 2016-04-02 MED ORDER — ONDANSETRON HCL 4 MG PO TABS
8.0000 mg | ORAL_TABLET | Freq: Four times a day (QID) | ORAL | Status: DC | PRN
  Administered 2016-04-02 – 2016-04-04 (×5): 8 mg via ORAL
  Filled 2016-04-02 (×5): qty 2

## 2016-04-02 MED ORDER — KETOROLAC TROMETHAMINE 30 MG/ML IJ SOLN: 30.0000 mg | Freq: Once | INTRAMUSCULAR | Status: DC

## 2016-04-02 MED ORDER — OXYCODONE HCL 5 MG/5ML PO SOLN: 5.0000 mg | Freq: Once | ORAL | Status: DC | PRN

## 2016-04-02 SURGICAL SUPPLY — 47 items
ADH SKN CLS APL DERMABOND .7 (GAUZE/BANDAGES/DRESSINGS) ×1
ADH SKN CLS LQ APL DERMABOND (GAUZE/BANDAGES/DRESSINGS) ×1
CANISTER SUCT 3000ML (MISCELLANEOUS) ×3 IMPLANT
CATH ROBINSON RED A/P 16FR (CATHETERS) IMPLANT
CLOTH BEACON ORANGE TIMEOUT ST (SAFETY) ×3 IMPLANT
COVER BACK TABLE 60X90IN (DRAPES) ×3 IMPLANT
DECANTER SPIKE VIAL GLASS SM (MISCELLANEOUS) ×6 IMPLANT
DERMABOND ADHESIVE PROPEN (GAUZE/BANDAGES/DRESSINGS) ×2
DERMABOND ADVANCED (GAUZE/BANDAGES/DRESSINGS) ×2
DERMABOND ADVANCED .7 DNX12 (GAUZE/BANDAGES/DRESSINGS) ×1 IMPLANT
DERMABOND ADVANCED .7 DNX6 (GAUZE/BANDAGES/DRESSINGS) ×1 IMPLANT
DRSG OPSITE POSTOP 3X4 (GAUZE/BANDAGES/DRESSINGS) ×3 IMPLANT
DURAPREP 26ML APPLICATOR (WOUND CARE) ×3 IMPLANT
ELECT REM PT RETURN 9FT ADLT (ELECTROSURGICAL) ×3
ELECTRODE REM PT RTRN 9FT ADLT (ELECTROSURGICAL) ×1 IMPLANT
FILTER SMOKE EVAC LAPAROSHD (FILTER) IMPLANT
FORCEPS CUTTING 33CM 5MM (CUTTING FORCEPS) IMPLANT
GLOVE BIOGEL PI IND STRL 7.0 (GLOVE) ×3 IMPLANT
GLOVE BIOGEL PI INDICATOR 7.0 (GLOVE) ×6
GLOVE ECLIPSE 7.0 STRL STRAW (GLOVE) ×6 IMPLANT
LEGGING LITHOTOMY PAIR STRL (DRAPES) ×3 IMPLANT
NS IRRIG 1000ML POUR BTL (IV SOLUTION) ×3 IMPLANT
PACK LAVH (CUSTOM PROCEDURE TRAY) ×3 IMPLANT
PACK ROBOTIC GOWN (GOWN DISPOSABLE) ×3 IMPLANT
PACK TRENDGUARD 450 HYBRID PRO (MISCELLANEOUS) ×1 IMPLANT
PACK TRENDGUARD 600 HYBRD PROC (MISCELLANEOUS) IMPLANT
PAD OB MATERNITY 4.3X12.25 (PERSONAL CARE ITEMS) ×3 IMPLANT
PROTECTOR NERVE ULNAR (MISCELLANEOUS) ×6 IMPLANT
SCISSORS LAP 5X35 DISP (ENDOMECHANICALS) IMPLANT
SET IRRIG TUBING LAPAROSCOPIC (IRRIGATION / IRRIGATOR) ×3 IMPLANT
SLEEVE XCEL OPT CAN 5 100 (ENDOMECHANICALS) ×6 IMPLANT
SOLUTION ELECTROLUBE (MISCELLANEOUS) IMPLANT
SUT MNCRL 0 MO-4 VIOLET 18 CR (SUTURE) ×2 IMPLANT
SUT MNCRL 0 VIOLET 6X18 (SUTURE) ×1 IMPLANT
SUT MONOCRYL 0 6X18 (SUTURE) ×2
SUT MONOCRYL 0 MO 4 18  CR/8 (SUTURE) ×4
SUT VIC AB 2-0 CT1 27 (SUTURE) ×12
SUT VIC AB 2-0 CT1 TAPERPNT 27 (SUTURE) ×4 IMPLANT
SUT VICRYL 0 UR6 27IN ABS (SUTURE) ×3 IMPLANT
SUT VICRYL RAPIDE 3 0 (SUTURE) ×3 IMPLANT
TOWEL OR 17X24 6PK STRL BLUE (TOWEL DISPOSABLE) ×6 IMPLANT
TRAY FOLEY CATH SILVER 16FR (SET/KITS/TRAYS/PACK) ×3 IMPLANT
TRENDGUARD 450 HYBRID PRO PACK (MISCELLANEOUS) ×3
TRENDGUARD 600 HYBRID PROC PK (MISCELLANEOUS)
TROCAR BALLN 12MMX100 BLUNT (TROCAR) ×3 IMPLANT
TROCAR XCEL NON-BLD 5MMX100MML (ENDOMECHANICALS) IMPLANT
WARMER LAPAROSCOPE (MISCELLANEOUS) ×3 IMPLANT

## 2016-04-02 NOTE — H&P (Signed)
Bonnie Gibson is an 41 y.o. white female, G3P3 who presents to the OR for an LAVH bilateral salpingectomy , possible TAH for a multiple yr  H.o. Chronic pelvic pain and heavy menses. She was offered a dx scope and declined. She a h.o. PID . HPI:  Past Medical History:  Diagnosis Date  . Anxiety   . Arthritis   . Complication of anesthesia    hard to wake up post-op  . Elevated white blood cell count    due to spherocytosis; states is usually 12,000  . Fatty liver   . Headache   . Hereditary spherocytosis (HCC)   . History of hiatal hernia   . Hypertension    has not been taking med.; advised to resume medication as directed today (08/30/2014)  . Impingement syndrome of left shoulder 08/2014  . PONV (postoperative nausea and vomiting)   . Spherocytosis, hereditary [facility])     Past Surgical History:  Procedure Laterality Date  . CHOLECYSTECTOMY N/A 03/19/2015   Procedure: LAPAROSCOPIC CHOLECYSTECTOMY WITH INTRAOPERATIVE CHOLANGIOGRAM;  Surgeon: Ovidio Kin, MD;  Location: [facility] OR;  Service: General;  Laterality: N/A;  . COLONOSCOPY    . FINGER SURGERY Left    thumb  . MYRINGOTOMY    . SHOULDER ARTHROSCOPY Right   . SHOULDER ARTHROSCOPY WITH SUBACROMIAL DECOMPRESSION Left 09/05/2014   Procedure: SHOULDER ARTHROSCOPY WITH SUBACROMIAL DECOMPRESSION;  Surgeon: Tarry Kos, MD;  Location: [facility];  Service: Orthopedics;  Laterality: Left;  . SPLENECTOMY, TOTAL    . TONSILLECTOMY    . TUBAL LIGATION  04/20/2002  . UPPER GI ENDOSCOPY    . WISDOM TOOTH EXTRACTION      History reviewed. No pertinent family history. Social History:  reports that she has never smoked. She has never used smokeless tobacco. She reports that she drinks alcohol. She reports that she does not use drugs.  Allergies:  Allergies  Allergen Reactions  . Other Rash    Beer,wine    Medications Prior to Admission  Medication Sig Dispense Refill  . acetaminophen (TYLENOL) 500 MG chewable tablet Chew  1,000 mg by mouth every 6 (six) hours as needed for pain.    Marland Kitchen ALPRAZolam (XANAX) 0.5 MG tablet Take 0.5 mg by mouth at bedtime as needed for anxiety.    . busPIRone (BUSPAR) 7.5 MG tablet Take 7.5 mg by mouth 2 (two) times daily as needed.    Marland Kitchen ibuprofen (ADVIL,MOTRIN) 200 MG tablet Take 1,000 mg by mouth 3 (three) times daily as needed for moderate pain.    Marland Kitchen oxymetazoline (AFRIN) 0.05 % nasal spray Place 1 spray into both nostrils as needed for congestion.    . propranolol (INDERAL) 20 MG tablet Take 20 mg by mouth 2 (two) times daily.    Marland Kitchen HYDROcodone-acetaminophen (NORCO/VICODIN) 5-325 MG tablet Take 1-2 tablets by mouth every 6 (six) hours as needed for moderate pain. (Patient not taking: Reported on 03/23/2016) 30 tablet 0  . ondansetron (ZOFRAN-ODT) 4 MG disintegrating tablet Take 1 tablet (4 mg total) by mouth every 8 (eight) hours as needed for nausea. (Patient not taking: Reported on 03/23/2016) 20 tablet 0       Blood pressure 133/80, pulse 66, temperature 97.6 F (36.4 C), temperature source Oral, resp. rate 16, height 5\' 5"  (1.651 m), weight 206 lb (93.4 kg), last menstrual period 03/25/2016, SpO2 97 %. General appearance: alert and cooperative Lungs: clear to auscultation bilaterally Heart: regular rate and rhythm, S1, S2 normal, no murmur, click, rub  or gallop Abdomen: soft, non-tender; bowel sounds normal; no masses,  no organomegaly   Lab Results  Component Value Date   WBC 13.8 (H) 03/27/2016   HGB 15.4 (H) 03/27/2016   HCT 42.9 03/27/2016   MCV 92.5 03/27/2016   PLT 768 (H) 03/27/2016   Lab Results  Component Value Date   PREGTESTUR NEGATIVE 04/02/2016   PREGSERUM NEGATIVE 03/18/2015       Patient Active Problem List   Diagnosis Date Noted  . Gall bladder disease 03/19/2015  IMP/ Chronic pelvic pain         Heavy menses Plan/ To OR for LAVH, possible TAH  ANDERSON,MARK E 04/02/2016, 12:06 PM

## 2016-04-02 NOTE — Transfer of Care (Signed)
Immediate Anesthesia Transfer of Care Note  Patient: Bonnie Gibson  Procedure(s) Performed: Procedure(s): LAPAROSCOPIC ASSISTED VAGINAL HYSTERECTOMY 120.4g  (N/A) BILATERAL SALPINGECTOMY (N/A)  Patient Location: PACU  Anesthesia Type:General  Level of Consciousness: awake, alert  and oriented  Airway & Oxygen Therapy: Patient Spontanous Breathing and Patient connected to nasal cannula oxygen  Post-op Assessment: Report given to RN and Post -op Vital signs reviewed and stable  Post vital signs: Reviewed and stable  Last Vitals:  Vitals:   04/02/16 1109  BP: 133/80  Pulse: 66  Resp: 16  Temp: 36.4 C    Last Pain:  Vitals:   04/02/16 1109  TempSrc: Oral  PainSc: 0-No pain      Patients Stated Pain Goal: 3 (04/02/16 1109)  Complications: No apparent anesthesia complications

## 2016-04-02 NOTE — Anesthesia Procedure Notes (Signed)
Procedure Name: Intubation Date/Time: 04/02/2016 12:29 PM Performed by: Jonna Munro Pre-anesthesia Checklist: Patient identified, Emergency Drugs available, Suction available, Patient being monitored and Timeout performed Patient Re-evaluated:Patient Re-evaluated prior to inductionOxygen Delivery Method: Circle system utilized Preoxygenation: Pre-oxygenation with 100% oxygen Intubation Type: IV induction, Cricoid Pressure applied and Rapid sequence Laryngoscope Size: Mac and 3 Grade View: Grade I Tube type: Oral Tube size: 7.0 mm Number of attempts: 1 Airway Equipment and Method: Stylet Secured at: 21 cm Tube secured with: Tape Dental Injury: Teeth and Oropharynx as per pre-operative assessment  Comments: Patient experiencing nausea before induction, RSI with cricoid pressure upon intubation.

## 2016-04-02 NOTE — Anesthesia Postprocedure Evaluation (Signed)
Anesthesia Post Note  Patient: Bonnie Gibson  Procedure(s) Performed: Procedure(s) (LRB): LAPAROSCOPIC ASSISTED VAGINAL HYSTERECTOMY 120.4g  (N/A) BILATERAL SALPINGECTOMY (N/A)  Patient location during evaluation: PACU Anesthesia Type: General Level of consciousness: awake Pain management: pain level controlled Vital Signs Assessment: post-procedure vital signs reviewed and stable Respiratory status: spontaneous breathing Cardiovascular status: stable Postop Assessment: no signs of nausea or vomiting Anesthetic complications: no        Last Vitals:  Vitals:   04/02/16 1408 04/02/16 1430  BP: 129/67 112/70  Pulse:  (!) 53  Resp: 14 16  Temp: 36.9 C     Last Pain:  Vitals:   04/02/16 1430  TempSrc:   PainSc: Asleep   Pain Goal: Patients Stated Pain Goal: 3 (04/02/16 1109)               Amamda Curbow JR,JOHN Susann GivensFRANKLIN

## 2016-04-02 NOTE — Anesthesia Preprocedure Evaluation (Signed)
Anesthesia Evaluation  Patient identified by MRN, date of birth, ID band Patient awake    Reviewed: Allergy & Precautions, H&P , NPO status , Patient's Chart, lab work & pertinent test results  Airway Mallampati: I  TM Distance: >3 FB Neck ROM: full    Dental  (+) Teeth Intact   Pulmonary neg pulmonary ROS,    Pulmonary exam normal        Cardiovascular negative cardio ROS Normal cardiovascular exam     Neuro/Psych negative neurological ROS  negative psych ROS   GI/Hepatic Neg liver ROS,   Endo/Other  negative endocrine ROS  Renal/GU negative Renal ROS     Musculoskeletal   Abdominal (+) + obese,   Peds  Hematology negative hematology ROS (+)   Anesthesia Other Findings   Reproductive/Obstetrics negative OB ROS                             Anesthesia Physical Anesthesia Plan  ASA: II  Anesthesia Plan: General   Post-op Pain Management:    Induction: Intravenous  Airway Management Planned: Oral ETT  Additional Equipment:   Intra-op Plan:   Post-operative Plan: Extubation in OR  Informed Consent: I have reviewed the patients History and Physical, chart, labs and discussed the procedure including the risks, benefits and alternatives for the proposed anesthesia with the patient or authorized representative who has indicated his/her understanding and acceptance.   Dental Advisory Given  Plan Discussed with: CRNA and Surgeon  Anesthesia Plan Comments:         Anesthesia Quick Evaluation

## 2016-04-03 ENCOUNTER — Encounter (HOSPITAL_COMMUNITY): Payer: Self-pay | Admitting: Obstetrics and Gynecology

## 2016-04-03 LAB — CBC
HCT: 33.4 % — ABNORMAL LOW (ref 36.0–46.0)
Hemoglobin: 12.2 g/dL (ref 12.0–15.0)
MCH: 33.5 pg (ref 26.0–34.0)
MCHC: 36.5 g/dL — ABNORMAL HIGH (ref 30.0–36.0)
MCV: 91.8 fL (ref 78.0–100.0)
PLATELETS: 563 10*3/uL — AB (ref 150–400)
RBC: 3.64 MIL/uL — AB (ref 3.87–5.11)
RDW: 12.2 % (ref 11.5–15.5)
WBC: 20.2 10*3/uL — AB (ref 4.0–10.5)

## 2016-04-03 MED ORDER — HYDROMORPHONE HCL 2 MG PO TABS
4.0000 mg | ORAL_TABLET | ORAL | Status: AC
  Administered 2016-04-03: 4 mg via ORAL
  Filled 2016-04-03: qty 2

## 2016-04-03 MED ORDER — DIPHENHYDRAMINE HCL 25 MG PO CAPS
25.0000 mg | ORAL_CAPSULE | Freq: Once | ORAL | Status: AC
  Administered 2016-04-03: 25 mg via ORAL
  Filled 2016-04-03: qty 1

## 2016-04-03 MED ORDER — HYDROMORPHONE HCL 2 MG PO TABS: 2.0000 mg | ORAL_TABLET | ORAL | Status: DC | PRN

## 2016-04-03 MED ORDER — MORPHINE SULFATE (PF) 4 MG/ML IV SOLN
4.0000 mg | Freq: Once | INTRAVENOUS | Status: AC
  Administered 2016-04-03: 4 mg via INTRAVENOUS
  Filled 2016-04-03: qty 1

## 2016-04-03 MED ORDER — MORPHINE SULFATE (PF) 4 MG/ML IV SOLN
4.0000 mg | INTRAVENOUS | Status: DC | PRN
  Administered 2016-04-03 – 2016-04-04 (×3): 4 mg via INTRAVENOUS
  Filled 2016-04-03 (×3): qty 1

## 2016-04-03 MED ORDER — OXYCODONE-ACETAMINOPHEN 5-325 MG PO TABS
2.0000 | ORAL_TABLET | ORAL | Status: DC | PRN
  Administered 2016-04-03 – 2016-04-04 (×2): 2 via ORAL
  Filled 2016-04-03 (×2): qty 2

## 2016-04-03 NOTE — Addendum Note (Signed)
Addendum  created 04/03/16 0755 by Algis GreenhouseLinda A Lorren Rossetti, CRNA   Sign clinical note

## 2016-04-03 NOTE — Op Note (Signed)
NAMERALONDA, TARTT NO.:  [id number]  MEDICAL RECORD NO.:  [id number]  LOCATION:                                 FACILITY:  PHYSICIAN:  Malva Limes, M.D.         DATE OF BIRTH:  DATE OF PROCEDURE: DATE OF DISCHARGE:                              OPERATIVE REPORT   PREOPERATIVE DIAGNOSES: 1. Chronic pelvic pain. 2. Heavy menses.  POSTOPERATIVE DIAGNOSES: 1. Chronic pelvic pain. 2. Heavy menses.  PROCEDURE:  Laparoscopic-assisted vaginal hysterectomy with bilateral salpingectomy.  SURGEON:  Malva Limes, M.D.  ASSISTANT:  Arlyce Dice.  ANESTHESIA:  General and local.  ANTIBIOTICS:  Ancef 2 g.  DRAINS:  Foley bedside drainage.  ESTIMATED BLOOD LOSS:  150 mL.  SPECIMENS:  Cervix, uterus, fallopian tubes, and Filshie clips sent to Pathology.  FINDINGS:  The patient had no evidence of any pelvic adhesions.  She had no endometriosis.  She did have past evidence of tubal ligation.  Uterus and pelvis appeared to be normal.  The bowel appeared to be normal.  PROCEDURE:  The patient was taken to the operating room, where she was placed in dorsal supine position.  A general anesthetic was administered without difficulty.  She was then placed in the dorsal lithotomy position.  She was prepped and draped in the usual fashion for this procedure.  A Hulka tenaculum was applied to the anterior cervical lip. A Foley catheter was placed.  Her umbilicus was then injected with 0.25% Marcaine.  A vertical skin incision was made through her previous scar. The fascia was then grasped and entered with the Mayo's.  The parietal peritoneum was entered bluntly.  A 12-mm Hasson cannula was placed into the abdominal cavity and insufflated.  The abdominal cavity was then insufflated with 3 L of carbon dioxide.  The patient was placed in Trendelenburg.  An exam was then performed.  She had a 5-mm port placed in the right and left lower quadrants under direct visualization.   The left ureter was then identified, fallopian tube and infundibulopelvic ligament identified.  The mesosalpinx was then cauterized and transected to the fundus of the uterus.  The ovarian ligament and round ligament were then cauterized and transected.  The broad ligament was then cauterized and transected down to the level of the uterine artery.  A similar procedure was performed on the opposite side.  This concluded the laparoscopic portion of the procedure.  Attention was then turned to the vagina.  The patient was repositioned.  A weighted speculum was placed in the posterior vagina.  The cervix was then injected with 1% lidocaine with epinephrine.  The posterior cul-de-sac was entered sharply.  Uterosacral ligaments were then bilaterally clamped, cut, and ligated with 0 Monocryl suture.  The cervix was circumscribed and the bladder pillars were bilaterally clamped, cut, and ligated with 0 Monocryl suture.  The cardinal ligaments were serially clamped, cut, and ligated with 0 Monocryl suture.  The uterine vessels were clamped, cut, and ligated with 0 Monocryl suture.  The anterior cul-de-sac had been entered without difficulty.  The remaining broad ligament was then bilaterally clamped, cut, and ligated with 0 Monocryl suture.  The specimen was removed.  All pedicles were checked and felt to be hemostatic.  The posterior cuff between the uterosacral ligaments was then run using 2-0 Vicryl in a running, locking fashion.  The remaining cuff was then closed vertically using 2-0 Vicryl in a running, locking fashion.  Hemostasis was checked and felt to be adequate.  The patient was then repositioned for laparoscopy.  Her abdomen was insufflated. The patient was placed in Trendelenburg and pelvis was copiously irrigated.  There was no evidence of any bleeding.  This concluded the procedure and the pneumoperitoneum released.  The ports were released and no bleeding was noted.  The incision  on the umbilicus was closed with 0 Vicryl suture in a running fashion.  The skin was closed using Dermabond.  The patient was awoken and taken to the recovery room in stable condition.  Instrument and lap counts were correct x3.    ______________________________ Malva LimesMark Anderson, M.D.   ______________________________ Malva LimesMark Anderson, M.D.    MA/MEDQ  D:  04/02/2016  T:  04/02/2016  Job:  [id number]

## 2016-04-03 NOTE — Anesthesia Postprocedure Evaluation (Addendum)
Anesthesia Post Note  Patient: Bonnie Gibson  Procedure(s) Performed: Procedure(s) (LRB): LAPAROSCOPIC ASSISTED VAGINAL HYSTERECTOMY 120.4g  (N/A) BILATERAL SALPINGECTOMY (N/A)  Patient location during evaluation: [facility] Anesthesia Type: General Level of consciousness: awake Pain management: satisfactory to patient Vital Signs Assessment: post-procedure vital signs reviewed and stable Respiratory status: spontaneous breathing Cardiovascular status: stable Anesthetic complications: no        Last Vitals:  Vitals:   04/02/16 2130 04/03/16 0347  BP: (!) 105/49 105/65  Pulse: (!) 52 80  Resp: 12 16  Temp: 36.7 C 36.7 C    Last Pain:  Vitals:   04/03/16 0433  TempSrc:   PainSc: 4    Pain Goal: Patients Stated Pain Goal: 1 (04/03/16 0433)               Cephus ShellingBURGER,LINDA

## 2016-04-04 MED ORDER — ONDANSETRON HCL 4 MG PO TABS: 4.0000 mg | ORAL_TABLET | Freq: Three times a day (TID) | ORAL | 0 refills | Status: DC | PRN

## 2016-04-04 MED ORDER — IBUPROFEN 800 MG PO TABS: 800.0000 mg | ORAL_TABLET | Freq: Three times a day (TID) | ORAL | 0 refills | Status: DC | PRN

## 2016-04-04 MED ORDER — DIPHENHYDRAMINE HCL 25 MG PO CAPS
50.0000 mg | ORAL_CAPSULE | Freq: Once | ORAL | Status: AC
  Administered 2016-04-04: 50 mg via ORAL
  Filled 2016-04-04: qty 2

## 2016-04-04 MED ORDER — OXYCODONE HCL 5 MG PO TABS: 5.0000 mg | ORAL_TABLET | Freq: Four times a day (QID) | ORAL | 0 refills | Status: DC | PRN

## 2016-04-04 NOTE — Progress Notes (Signed)
Pt reports sore throat and states that she has been drinking cold pepsi and that has not been helping with throat. RN brought patient ice chips and cold water to drink.

## 2016-04-04 NOTE — Progress Notes (Signed)
POD#2 Pt states that she is still having significant pain post op. The po meds are starting to relieve the pain. She has mild itching with Dilaudid/vicodin/percocet. She states that she has a very low pain tolerance. Yesterday required morphine for pain relief. She is unsure if she is completely emptying her bladder. VSSAF PE: Abd - soft, incisions healing well.        No vaginal bleeding IMP/ doing well post op         Poor pain tolerance         ? Urinary retention Plan/ Will check PVR           If wnl will discharge with percocet/zofran/motrin

## 2016-04-06 NOTE — Discharge Summary (Signed)
Pt was admitted for a LAVH with bilateral salpingectomy for chronic pelvic pain and heavy menses. Please see dictated op note for complete discription of the procedure. The pathology was benign. During her post op course it was difficult controlling her pain. Pt stated that she has a h.o. Poor pain control. She remained afebrile, her incisions were healing well. She had + flatus and nl bowel sounds. She was discharged to home on pod #2 and instructed to RTO in 1 week.

## 2016-04-08 DIAGNOSIS — J0100 Acute maxillary sinusitis, unspecified: Secondary | ICD-10-CM | POA: Diagnosis not present

## 2016-05-26 IMAGING — NM NM HEPATO W/GB/PHARM/[PERSON_NAME]
3 series · 13 of 13 positions shown · non-contrast
Comparison: Abdominal ultrasound 12/07/2014

CLINICAL DATA: Right-sided abdominal pain

EXAM:
NUCLEAR MEDICINE HEPATOBILIARY IMAGING WITH GALLBLADDER EF
TECHNIQUE: Sequential images of the abdomen were obtained [DATE] minutes
following intravenous administration of radiopharmaceutical. After
slow intravenous infusion of 1.84 micrograms Cholecystokinin,
gallbladder ejection fraction was determined.
RADIOPHARMACEUTICALS:  5.0 mCi Oc-GGm Choletec IV

[he hepatobiliary · 3.43mm/px · 6 of 45 frames shown (1 of 3)]
[frame 4/45]
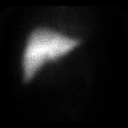
[frame 12/45]
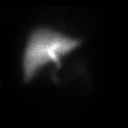
[frame 19/45]
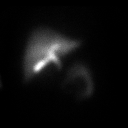
[frame 27/45]
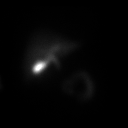
[frame 34/45]
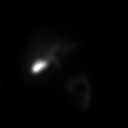
[frame 42/45]
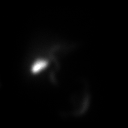

[he hepatobiliary · 1 of 1 slices shown (2 of 3)]
[im 1/1]
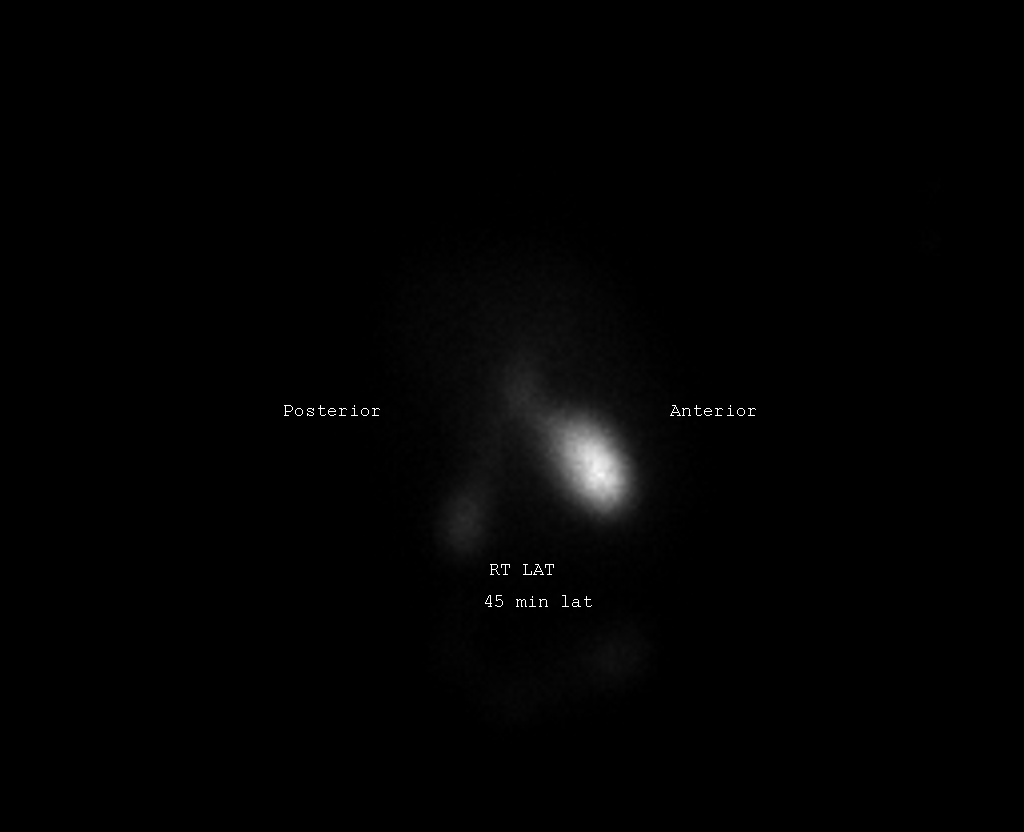

[he hepatobiliary · 3.43mm/px · 6 of 60 frames shown (3 of 3)]
[frame 6/60]
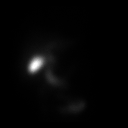
[frame 16/60]
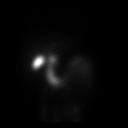
[frame 26/60]
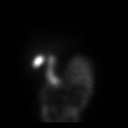
[frame 36/60]
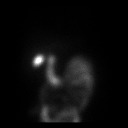
[frame 46/60]
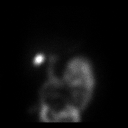
[frame 56/60]
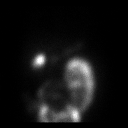

[13 of 13 positions shown; findings below may reference images not displayed]

FINDINGS: Hepatic uptake is satisfactory. There is uptake within the
gallbladder by 20 minutes. Following injection of CCK, the ejection
fraction is 84% at 45 minutes. There is no significant pain with the
infusion of CCK.. At 45 min, normal ejection fraction is greater
than 40%.
IMPRESSION: Negative hepatobiliary study. No evidence for acute or chronic
cholecystitis. Normal gallbladder ejection fraction.

## 2016-06-10 ENCOUNTER — Other Ambulatory Visit: Payer: Self-pay | Admitting: Family Medicine

## 2016-06-10 DIAGNOSIS — R42 Dizziness and giddiness: Secondary | ICD-10-CM

## 2016-06-22 ENCOUNTER — Ambulatory Visit
Admission: RE | Admit: 2016-06-22 | Discharge: 2016-06-22 | Disposition: A | Discharge status: Home / Self Care | Payer: Commercial Managed Care - HMO | Source: Ambulatory Visit | Attending: Family Medicine | Admitting: Family Medicine

## 2016-06-22 DIAGNOSIS — R42 Dizziness and giddiness: Secondary | ICD-10-CM

## 2016-06-22 MED ORDER — GADOBENATE DIMEGLUMINE 529 MG/ML IV SOLN
20.0000 mL | Freq: Once | INTRAVENOUS | Status: AC | PRN
  Administered 2016-06-22: 20 mL via INTRAVENOUS

## 2016-07-13 DIAGNOSIS — J209 Acute bronchitis, unspecified: Secondary | ICD-10-CM | POA: Diagnosis not present

## 2016-07-17 DIAGNOSIS — H40033 Anatomical narrow angle, bilateral: Secondary | ICD-10-CM | POA: Diagnosis not present

## 2016-07-17 DIAGNOSIS — H04123 Dry eye syndrome of bilateral lacrimal glands: Secondary | ICD-10-CM | POA: Diagnosis not present

## 2016-07-24 NOTE — Addendum Note (Signed)
Addendum  created 07/24/16 1008 by Leilani AbleHatchett, Misk Galentine, MD   Sign clinical note

## 2016-08-11 DIAGNOSIS — H578 Other specified disorders of eye and adnexa: Secondary | ICD-10-CM | POA: Diagnosis not present

## 2016-09-08 ENCOUNTER — Telehealth (INDEPENDENT_AMBULATORY_CARE_PROVIDER_SITE_OTHER): Payer: Self-pay | Admitting: Orthopaedic Surgery

## 2016-09-08 NOTE — Telephone Encounter (Signed)
Meloxicam 15 mg daily prn pain #30.  3 refills

## 2016-09-08 NOTE — Telephone Encounter (Signed)
PT STATED SHE WAS SEEN MY DR. Roda ShuttersXU FOR HER TAIL BONE A COUPLE YEARS AGO AND STATED THE PAIN HAS COME BACK. SHE HAD MRI DONE 2 YEARS AGO. WANTS TO KNOW IF HE CAN PRESCRIBE AN ANTIINFLAMMATORY OR IF SHE NEEDS TO COME IN AGAIN.  [phone]

## 2016-09-08 NOTE — Telephone Encounter (Signed)
Please advise 

## 2016-09-09 ENCOUNTER — Other Ambulatory Visit (INDEPENDENT_AMBULATORY_CARE_PROVIDER_SITE_OTHER): Payer: Self-pay | Admitting: Orthopaedic Surgery

## 2016-09-09 MED ORDER — MELOXICAM 15 MG PO TABS: 15.0000 mg | ORAL_TABLET | Freq: Every day | ORAL | 3 refills | Status: DC | PRN

## 2016-09-09 NOTE — Telephone Encounter (Signed)
RX CALLED INTO PHARM, CALLED PT TO ADVISE. SHE WILL CALL US BACK TO MAKE APPT TO SEE DR Roda ShuttersXU. SHE WILL CHECK WITH HER BOSS FIRST

## 2016-09-14 ENCOUNTER — Ambulatory Visit (INDEPENDENT_AMBULATORY_CARE_PROVIDER_SITE_OTHER): Payer: 59 | Admitting: Orthopaedic Surgery

## 2016-09-14 ENCOUNTER — Encounter (INDEPENDENT_AMBULATORY_CARE_PROVIDER_SITE_OTHER): Payer: Self-pay | Admitting: Orthopaedic Surgery

## 2016-09-14 DIAGNOSIS — M533 Sacrococcygeal disorders, not elsewhere classified: Secondary | ICD-10-CM | POA: Diagnosis not present

## 2016-09-14 MED ORDER — CELECOXIB 200 MG PO CAPS: 200.0000 mg | ORAL_CAPSULE | Freq: Two times a day (BID) | ORAL | 3 refills | Status: DC

## 2016-09-14 NOTE — Progress Notes (Signed)
Office Visit Note   Patient: Bonnie Gibson           Date of Birth: 08/17/1975           MRN: [id number] Visit Date: 09/14/2016              Requested by: Clovis RileyMitchell, L.August Saucerean, MD [address] [place], [place] [address] PCP: Clovis RileyMitchell, L.August Saucerean, MD   Assessment & Plan: Visit Diagnoses:  1. Coccyx pain     Plan: MRI of her sacrum and coccyx is to rule out structural abnormality. We will be in touch with her about the results. If the MRI is negative we'll refer her to her primary care doctor OB/GYN. Celebrex was prescribed today.  Follow-Up Instructions: Return if symptoms worsen or fail to improve.   Orders:  Orders Placed This Encounter  Procedures  . MR SACRUM SI JOINTS WO CONTRAST   Meds ordered this encounter  Medications  . celecoxib (CELEBREX) 200 MG capsule    Sig: Take 1 capsule (200 mg total) by mouth 2 (two) times daily.    Dispense:  30 capsule    Refill:  3      Procedures: No procedures performed   Clinical Data: No additional findings.   Subjective: Chief Complaint  Patient presents with  . Lower Back - Tailbone Pain    Bonnie Gibson comes in today with recurrent coccyx pain for about a month now she denies any injuries. This is been an ongoing issue. She had MRI about 2-3 years ago that was normal. Meloxicam helps but it upsets her stomach.    Review of Systems  Constitutional: Negative.   HENT: Negative.   Eyes: Negative.   Respiratory: Negative.   Cardiovascular: Negative.   Endocrine: Negative.   Musculoskeletal: Negative.   Neurological: Negative.   Hematological: Negative.   Psychiatric/Behavioral: Negative.   All other systems reviewed and are negative.    Objective: Vital Signs: There were no vitals taken for this visit.  Physical Exam  Constitutional: She is oriented to person, place, and time. She appears well-developed and well-nourished.  Pulmonary/Chest: Effort normal.  Neurological: She is alert and oriented to  person, place, and time.  Skin: Skin is warm. Capillary refill takes less than 2 seconds.  Psychiatric: She has a normal mood and affect. Her behavior is normal. Judgment and thought content normal.  Nursing note and vitals reviewed.   Ortho Exam Tenderness over the coccyx. Otherwise exam is unremarkable. Specialty Comments:  No specialty comments available.  Imaging: No results found.   PMFS History: Patient Active Problem List   Diagnosis Date Noted  . Pelvic pain in female 04/02/2016  . 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 Anxiety   . Arthritis   . Complication of anesthesia    hard to wake up post-op  . Elevated white blood cell count    due to spherocytosis; states is usually 12,000  . Fatty liver   . Headache   . Hereditary spherocytosis (HCC)   . History of hiatal hernia   . Hypertension    has not been taking med.; advised to resume medication as directed today (08/30/2014)  . Impingement syndrome of left shoulder 08/2014  . PONV (postoperative nausea and vomiting)   . Spherocytosis, hereditary (HCC)     No family history on file.  Past Surgical History:  Procedure Laterality Date  . BILATERAL SALPINGECTOMY N/A 04/02/2016   Procedure: BILATERAL SALPINGECTOMY;  Surgeon: Levi AlandMark E Anderson, MD;  Location: [facility];  Service: Gynecology;  Laterality: N/A;  . CHOLECYSTECTOMY N/A 03/19/2015   Procedure: LAPAROSCOPIC CHOLECYSTECTOMY WITH INTRAOPERATIVE CHOLANGIOGRAM;  Surgeon: Ovidio Kinavid Newman, MD;  [facility];  Service: General;  Laterality: N/A;  . COLONOSCOPY    . FINGER SURGERY Left    thumb  . LAPAROSCOPIC ASSISTED VAGINAL HYSTERECTOMY N/A 04/02/2016   Procedure: LAPAROSCOPIC ASSISTED VAGINAL HYSTERECTOMY 120.4g ;  Surgeon: Levi AlandMark E Anderson, MD;  Location: [facility];  Service: Gynecology;  Laterality: N/A;  . MYRINGOTOMY    . SHOULDER ARTHROSCOPY Right   . SHOULDER ARTHROSCOPY WITH SUBACROMIAL DECOMPRESSION Left 09/05/2014    Procedure: SHOULDER ARTHROSCOPY WITH SUBACROMIAL DECOMPRESSION;  Surgeon: Tarry KosNaiping M Annina Piotrowski, MD;  Location: [facility];  Service: Orthopedics;  Laterality: Left;  . SPLENECTOMY, TOTAL    . TONSILLECTOMY    . TUBAL LIGATION  04/20/2002  . UPPER GI ENDOSCOPY    . WISDOM TOOTH EXTRACTION     Social History   Occupational History  . Not on file.   Social History Main Topics  . Smoking status: Never Smoker  . Smokeless tobacco: Never Used  . Alcohol use Yes     Comment: occasionally  . Drug use: No  . Sexual activity: Yes    Birth control/ protection: Surgical

## 2016-09-17 ENCOUNTER — Ambulatory Visit (INDEPENDENT_AMBULATORY_CARE_PROVIDER_SITE_OTHER): Payer: Self-pay | Admitting: Orthopaedic Surgery

## 2016-09-19 DIAGNOSIS — H04123 Dry eye syndrome of bilateral lacrimal glands: Secondary | ICD-10-CM | POA: Diagnosis not present

## 2016-10-15 ENCOUNTER — Ambulatory Visit
Admission: RE | Admit: 2016-10-15 | Discharge: 2016-10-15 | Disposition: A | Discharge status: Home / Self Care | Payer: Commercial Managed Care - HMO | Source: Ambulatory Visit | Attending: Orthopaedic Surgery | Admitting: Orthopaedic Surgery

## 2016-10-15 DIAGNOSIS — M533 Sacrococcygeal disorders, not elsewhere classified: Secondary | ICD-10-CM | POA: Diagnosis not present

## 2016-10-15 NOTE — Progress Notes (Signed)
Please let her know MRi scan was normal.

## 2016-11-06 ENCOUNTER — Ambulatory Visit (INDEPENDENT_AMBULATORY_CARE_PROVIDER_SITE_OTHER): Payer: 59 | Admitting: Orthopaedic Surgery

## 2016-11-16 ENCOUNTER — Encounter (HOSPITAL_COMMUNITY): Payer: Self-pay | Admitting: Emergency Medicine

## 2016-11-16 ENCOUNTER — Emergency Department (HOSPITAL_COMMUNITY)
Admission: EM | Admit: 2016-11-16 | Discharge: 2016-11-16 | Disposition: A | Discharge status: Home / Self Care | Payer: 59 | Attending: Emergency Medicine | Admitting: Emergency Medicine

## 2016-11-16 ENCOUNTER — Emergency Department (HOSPITAL_COMMUNITY): Payer: 59

## 2016-11-16 DIAGNOSIS — K760 Fatty (change of) liver, not elsewhere classified: Secondary | ICD-10-CM | POA: Insufficient documentation

## 2016-11-16 DIAGNOSIS — Z79899 Other long term (current) drug therapy: Secondary | ICD-10-CM | POA: Insufficient documentation

## 2016-11-16 DIAGNOSIS — I10 Essential (primary) hypertension: Secondary | ICD-10-CM | POA: Diagnosis not present

## 2016-11-16 DIAGNOSIS — R109 Unspecified abdominal pain: Secondary | ICD-10-CM | POA: Diagnosis not present

## 2016-11-16 DIAGNOSIS — R1110 Vomiting, unspecified: Secondary | ICD-10-CM | POA: Diagnosis not present

## 2016-11-16 DIAGNOSIS — R1031 Right lower quadrant pain: Secondary | ICD-10-CM | POA: Diagnosis not present

## 2016-11-16 LAB — CBC
HEMATOCRIT: 40.6 % (ref 36.0–46.0)
HEMOGLOBIN: 14.2 g/dL (ref 12.0–15.0)
MCH: 32.5 pg (ref 26.0–34.0)
MCHC: 35 g/dL (ref 30.0–36.0)
MCV: 92.9 fL (ref 78.0–100.0)
Platelets: 589 10*3/uL — ABNORMAL HIGH (ref 150–400)
RBC: 4.37 MIL/uL (ref 3.87–5.11)
RDW: 12.5 % (ref 11.5–15.5)
WBC: 10.6 10*3/uL — AB (ref 4.0–10.5)

## 2016-11-16 LAB — URINALYSIS, ROUTINE W REFLEX MICROSCOPIC
Bilirubin Urine: NEGATIVE
GLUCOSE, UA: NEGATIVE mg/dL
Ketones, ur: NEGATIVE mg/dL
LEUKOCYTES UA: NEGATIVE
Nitrite: NEGATIVE
PH: 6 (ref 5.0–8.0)
PROTEIN: NEGATIVE mg/dL
Specific Gravity, Urine: 1.025 (ref 1.005–1.030)

## 2016-11-16 LAB — URINALYSIS, MICROSCOPIC (REFLEX)

## 2016-11-16 LAB — COMPREHENSIVE METABOLIC PANEL
ALT: 58 U/L — AB (ref 14–54)
AST: 42 U/L — ABNORMAL HIGH (ref 15–41)
Albumin: 3.8 g/dL (ref 3.5–5.0)
Alkaline Phosphatase: 54 U/L (ref 38–126)
Anion gap: 9 (ref 5–15)
BUN: 9 mg/dL (ref 6–20)
CHLORIDE: 106 mmol/L (ref 101–111)
CO2: 24 mmol/L (ref 22–32)
CREATININE: 0.63 mg/dL (ref 0.44–1.00)
Calcium: 9 mg/dL (ref 8.9–10.3)
Glucose, Bld: 92 mg/dL (ref 65–99)
POTASSIUM: 3.6 mmol/L (ref 3.5–5.1)
SODIUM: 139 mmol/L (ref 135–145)
Total Bilirubin: 2.7 mg/dL — ABNORMAL HIGH (ref 0.3–1.2)
Total Protein: 7.8 g/dL (ref 6.5–8.1)

## 2016-11-16 LAB — LIPASE, BLOOD: Lipase: 28 U/L (ref 11–51)

## 2016-11-16 MED ORDER — ONDANSETRON 4 MG PO TBDP: 4.0000 mg | ORAL_TABLET | Freq: Three times a day (TID) | ORAL | 0 refills | Status: DC | PRN

## 2016-11-16 MED ORDER — IOPAMIDOL (ISOVUE-300) INJECTION 61%
INTRAVENOUS | Status: AC
  Administered 2016-11-16: 100 mL via INTRAVENOUS
  Filled 2016-11-16: qty 100

## 2016-11-16 MED ORDER — ONDANSETRON 4 MG PO TBDP
4.0000 mg | ORAL_TABLET | Freq: Once | ORAL | Status: AC | PRN
  Administered 2016-11-16: 4 mg via ORAL

## 2016-11-16 MED ORDER — ONDANSETRON HCL 4 MG/2ML IJ SOLN
4.0000 mg | Freq: Once | INTRAMUSCULAR | Status: AC
  Administered 2016-11-16: 4 mg via INTRAVENOUS
  Filled 2016-11-16: qty 2

## 2016-11-16 MED ORDER — MORPHINE SULFATE (PF) 4 MG/ML IV SOLN
4.0000 mg | Freq: Once | INTRAVENOUS | Status: AC
  Administered 2016-11-16: 4 mg via INTRAVENOUS
  Filled 2016-11-16: qty 1

## 2016-11-16 MED ORDER — ONDANSETRON 4 MG PO TBDP
ORAL_TABLET | ORAL | Status: AC
  Filled 2016-11-16: qty 1

## 2016-11-16 MED ORDER — SODIUM CHLORIDE 0.9 % IV BOLUS (SEPSIS)
1000.0000 mL | Freq: Once | INTRAVENOUS | Status: AC
  Administered 2016-11-16: 1000 mL via INTRAVENOUS

## 2016-11-16 MED ORDER — HYDROMORPHONE HCL 1 MG/ML IJ SOLN
1.0000 mg | Freq: Once | INTRAMUSCULAR | Status: AC
  Administered 2016-11-16: 1 mg via INTRAVENOUS
  Filled 2016-11-16: qty 1

## 2016-11-16 MED ORDER — HYDROMORPHONE HCL 1 MG/ML IJ SOLN
0.5000 mg | Freq: Once | INTRAMUSCULAR | Status: AC
  Administered 2016-11-16: 0.5 mg via INTRAVENOUS
  Filled 2016-11-16: qty 1

## 2016-11-16 NOTE — ED Notes (Signed)
Pt. Stated that she'd like a doctor's note for her workplace.

## 2016-11-16 NOTE — ED Notes (Signed)
PA at bedside at this time.  

## 2016-11-16 NOTE — ED Provider Notes (Signed)
[facility]PT Provider Note   CSN: [id number] Arrival date & time: 11/16/16  1219     History   Chief Complaint Chief Complaint  Patient presents with  . Abdominal Pain    HPI Bonnie Gibson is a 41 y.o. female with history of hysterectomy and bilateral salpingectomy on 04/02/2016 who presents with acute right lower quadrant pain that began around 1 AM this morning. Her pain has been constant with worsening sharp pains intermittently. She has had associated nausea and vomiting. She had one episode of diarrhea this morning. She denies any fevers. She has taken Phenergan at home without relief. Patient reports she has had some vertigo, however she states she has chronic vertigo and takes medication. She did not take her medication today. It occurred when she turned over in bed. Patient reports some mild low back pain over the past few days, however she did not associated with the symptoms, she states it felt like a sore muscle. Patient denies any urinary symptoms. Patient is status post cholecystectomy and splenectomy. She denies any chest pain, shortness of breath, abnormal vaginal bleeding or discharge.  HPI  Past Medical History:  Diagnosis Date  . Anxiety   . Arthritis   . Complication of anesthesia    hard to wake up post-op  . Elevated white blood cell count    due to spherocytosis; states is usually 12,000  . Fatty liver   . Headache   . Hereditary spherocytosis (HCC)   . History of hiatal hernia   . Hypertension    has not been taking med.; advised to resume medication as directed today (08/30/2014)  . Impingement syndrome of left shoulder 08/2014  . PONV (postoperative nausea and vomiting)   . Spherocytosis, hereditary [facility])     Patient Active Problem List   Diagnosis Date Noted  . Pelvic pain in female 04/02/2016  . 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 BILATERAL SALPINGECTOMY N/A 04/02/2016   Procedure:  BILATERAL SALPINGECTOMY;  Surgeon: Levi Aland, MD;  Location: [facility];  Service: Gynecology;  Laterality: N/A;  . CHOLECYSTECTOMY N/A 03/19/2015   Procedure: LAPAROSCOPIC CHOLECYSTECTOMY WITH INTRAOPERATIVE CHOLANGIOGRAM;  Surgeon: Ovidio Kin, MD;  Location: [facility] OR;  Service: General;  Laterality: N/A;  . COLONOSCOPY    . FINGER SURGERY Left    thumb  . LAPAROSCOPIC ASSISTED VAGINAL HYSTERECTOMY N/A 04/02/2016   Procedure: LAPAROSCOPIC ASSISTED VAGINAL HYSTERECTOMY 120.4g ;  Surgeon: Levi Aland, MD;  Location: [facility];  Service: Gynecology;  Laterality: N/A;  . MYRINGOTOMY    . SHOULDER ARTHROSCOPY Right   . SHOULDER ARTHROSCOPY WITH SUBACROMIAL DECOMPRESSION Left 09/05/2014   Procedure: SHOULDER ARTHROSCOPY WITH SUBACROMIAL DECOMPRESSION;  Surgeon: Tarry Kos, MD;  Location: [facility];  Service: Orthopedics;  Laterality: Left;  . SPLENECTOMY, TOTAL    . TONSILLECTOMY    . TUBAL LIGATION  04/20/2002  . UPPER GI ENDOSCOPY    . WISDOM TOOTH EXTRACTION      OB History    No data available       Home Medications    Prior to Admission medications   Medication Sig Start Date End Date Taking? Authorizing Provider  busPIRone (BUSPAR) 7.5 MG tablet Take 7.5 mg by mouth 2 (two) times daily as needed.    [provider]  celecoxib (CELEBREX) 200 MG capsule Take 1 capsule (200 mg total) by mouth 2 (two) times daily. 09/14/16   Gershon Mussel  M, MD  ibuprofen (ADVIL,MOTRIN) 800 MG tablet Take 1 tablet (800 mg total) by mouth every 8 (eight) hours as needed. 04/04/16   Levi Aland, MD  meloxicam (MOBIC) 15 MG tablet Take 1 tablet (15 mg total) by mouth daily as needed for pain. 09/09/16   Tarry Kos, MD  ondansetron (ZOFRAN) 4 MG tablet Take 1 tablet (4 mg total) by mouth every 8 (eight) hours as needed for nausea or vomiting. 04/04/16   Levi Aland, MD  oxyCODONE (OXY IR/ROXICODONE) 5 MG immediate release tablet Take 1-2 tablets (5-10 mg total) by mouth every  6 (six) hours as needed for severe pain. Patient not taking: Reported on 09/14/2016 04/04/16   Levi Aland, MD    Family History History reviewed. No pertinent family history.  Social History Social History  Substance Use Topics  . Smoking status: Never Smoker  . Smokeless tobacco: Never Used  . Alcohol use Yes     Comment: occasionally     Allergies   Other   Review of Systems Review of Systems  Constitutional: Negative for chills and fever.  HENT: Negative for facial swelling and sore throat.   Respiratory: Negative for shortness of breath.   Cardiovascular: Negative for chest pain.  Gastrointestinal: Positive for abdominal pain, diarrhea, nausea and vomiting.  Genitourinary: Negative for dysuria, pelvic pain, vaginal bleeding, vaginal discharge and vaginal pain.  Musculoskeletal: Positive for back pain.  Skin: Negative for rash and wound.  Neurological: Negative for headaches.  Psychiatric/Behavioral: The patient is not nervous/anxious.      Physical Exam Updated Vital Signs BP 129/75   Pulse 69   Temp 98 F (36.7 C) (Oral)   Resp 16   LMP 03/25/2016 (Exact Date)   SpO2 98%   Physical Exam  Constitutional: She appears well-developed and well-nourished. No distress.  HENT:  Head: Normocephalic and atraumatic.  Mouth/Throat: Oropharynx is clear and moist. No oropharyngeal exudate.  Eyes: Pupils are equal, round, and reactive to light. Conjunctivae are normal. Right eye exhibits no discharge. Left eye exhibits no discharge. No scleral icterus.  Neck: Normal range of motion. Neck supple. No thyromegaly present.  Cardiovascular: Normal rate, regular rhythm, normal heart sounds and intact distal pulses.  Exam reveals no gallop and no friction rub.   No murmur heard. Pulmonary/Chest: Effort normal and breath sounds normal. No stridor. No respiratory distress. She has no wheezes. She has no rales.  Abdominal: Soft. Bowel sounds are normal. She exhibits no  distension. There is tenderness in the right lower quadrant and suprapubic area. There is rebound (in RLQ) and tenderness at McBurney's point. There is no guarding and no CVA tenderness.  Positive Rovsing's  Musculoskeletal: She exhibits no edema.       Back:  Lymphadenopathy:    She has no cervical adenopathy.  Neurological: She is alert. Coordination normal.  Skin: Skin is warm and dry. No rash noted. She is not diaphoretic. No pallor.  Psychiatric: She has a normal mood and affect.  Nursing note and vitals reviewed.    ED Treatments / Results  Labs (all labs ordered are listed, but only abnormal results are displayed) Labs Reviewed  COMPREHENSIVE METABOLIC PANEL - Abnormal; Notable for the following:       Result Value   AST 42 (*)    ALT 58 (*)    Total Bilirubin 2.7 (*)    All other components within normal limits  CBC - Abnormal; Notable for the following:  WBC 10.6 (*)    Platelets 589 (*)    All other components within normal limits  URINALYSIS, ROUTINE W REFLEX MICROSCOPIC - Abnormal; Notable for the following:    APPearance HAZY (*)    Hgb urine dipstick SMALL (*)    All other components within normal limits  URINALYSIS, MICROSCOPIC (REFLEX) - Abnormal; Notable for the following:    Bacteria, UA MANY (*)    Squamous Epithelial / LPF 6-30 (*)    All other components within normal limits  LIPASE, BLOOD    EKG  EKG Interpretation None       Radiology No results found.  Procedures Procedures (including critical care time)  Medications Ordered in ED Medications  ondansetron (ZOFRAN-ODT) 4 MG disintegrating tablet (not administered)  iopamidol (ISOVUE-300) 61 % injection (not administered)  ondansetron (ZOFRAN-ODT) disintegrating tablet 4 mg (4 mg Oral Given 11/16/16 1240)  sodium chloride 0.9 % bolus 1,000 mL (1,000 mLs Intravenous New Bag/Given 11/16/16 1536)  morphine 4 MG/ML injection 4 mg (4 mg Intravenous Given 11/16/16 1536)  ondansetron (ZOFRAN)  injection 4 mg (4 mg Intravenous Given 11/16/16 1536)  HYDROmorphone (DILAUDID) injection 0.5 mg (0.5 mg Intravenous Given 11/16/16 1601)     Initial Impression / Assessment and Plan / ED Course  I have reviewed the triage vital signs and the nursing notes.  Pertinent labs & imaging results that were available during my care of the patient were reviewed by me and considered in my medical decision making (see chart for details).     Patient with acute onset right lower quadrant pain around 1 AM today. Patient status post cholecystectomy, abdominal hysterectomy with bilateral salpingectomy, and splenectomy. CBC shows WBC 10.6. CMP shows AST 42, ALT 58, total bilirubin 2.7. UA shows small hematuria, many bacteria, however 6-30 squamous epithelial cells. Gout pelvic cause as patient denies any vaginal bleeding or discharge and is status post hysterectomy. Concern for appendicitis. CT abdomen pelvis is pending. At shift change, patient care transferred to [facility], PA-C for continued evaluation, follow up of CTAP and determination of disposition.   Final Clinical Impressions(s) / ED Diagnoses   Final diagnoses:  Right lower quadrant abdominal pain    New Prescriptions New Prescriptions   No medications on file     Verdis Prime 11/16/16 1603    Gerhard Munch, MD 11/16/16 1606

## 2016-11-16 NOTE — ED Notes (Signed)
Patient transported to CT via stretcher.

## 2016-11-16 NOTE — ED Notes (Addendum)
Cup of Sprite with ice provided to patient. No acute distress noted.

## 2016-11-16 NOTE — ED Provider Notes (Signed)
Received signout from [employer]. Refer to provider note for fully H&P. Briefly, patient is a 41 year old female with history of hysterectomy and bilateral salpingectomy presenting with acute lower abdominal pain worse in RLQ which began around 1 AM this morning. On exam, + rebound, +rovsing's, +McBurney's point. Awaiting CT scan of the abdomen for further evaluation. Physical Exam  BP 116/73   Pulse 66   Temp 98 F (36.7 C) (Oral)   Resp 16   LMP 03/25/2016 (Exact Date)   SpO2 98%   Physical Exam  Constitutional: She appears well-developed and well-nourished. No distress.  Resting in bed.   HENT:  Head: Normocephalic and atraumatic.  Eyes: Conjunctivae are normal. Right eye exhibits no discharge. Left eye exhibits no discharge.  Neck: No JVD present. No tracheal deviation present.  Cardiovascular: Normal rate and regular rhythm.   Pulmonary/Chest: Effort normal and breath sounds normal.  Abdominal: Soft. She exhibits no distension. There is tenderness. There is guarding.  Maximally tender to palpation in the right lower quadrant with generalized lower abdominal TTP. Murphy sign absent, there is McBurney's point tenderness, Rovsing's present, no CVA tenderness  Musculoskeletal: She exhibits no edema.  Neurological: She is alert.  Skin: Skin is warm and dry. No erythema.  Psychiatric: She has a normal mood and affect. Her behavior is normal.  Nursing note and vitals reviewed.   ED Course  Procedures  MDM CT scan shows no acute abdominal or pelvic pathology, normal appendix, and hepatic steatosis. Low suspicion of ovarian torsion s/p salpingectomy. Low suspicion of PID s/p vaginal hysterectomy (cervix removed). No RBCs in the urine, low suspicion of nephrolithiasis, and low suspicion of UTI or pyelonephritis. No evidence of obstruction, perforation, or other acute surgical abdominal pathology. On reevaluation, patient states her pain has improved, she is tolerating PO fluids without  difficulty. Repeat abdominal examination shows pain is improved but is still present. She is stable for discharge home with follow-up with primary care physician. Discussed indications for return to the ED. Pt and patient's family verbalized understanding of and agreement with plan and is safe for discharge home at this time.        Jeanie Sewer, PA-C 11/17/16 0116    Melene Plan, DO 11/19/16 [phone]

## 2016-11-16 NOTE — ED Triage Notes (Signed)
Pt to ER for acute onset abdominal pain with nausea vomiting this morning awakening patient from sleep. Reports diarrhea as well, states however it is not unusual for her.

## 2016-11-16 NOTE — Discharge Instructions (Signed)
Alternate 600 mg of ibuprofen and [phone] mg of Tylenol every 3 hours as needed for pain. Do not exceed 4000 mg of Tylenol daily. Apply heat to the lower abdomen for comfort. Take Zofran as needed for nausea and vomiting. Drink plenty of clear fluids and eat a diet of bland foods that will not upset your stomach such as saltine crackers and mashed potatoes for the next few days. Follow-up with your primary care physician for reevaluation of your symptoms in the next 24-48 hours. Return to the ED immediately if any concerning signs or symptoms develop such as blood in your urine or stool, if you are unable to keep any food or drink down, or worsening pain.

## 2016-11-19 DIAGNOSIS — K760 Fatty (change of) liver, not elsewhere classified: Secondary | ICD-10-CM | POA: Diagnosis not present

## 2016-11-19 DIAGNOSIS — H8110 Benign paroxysmal vertigo, unspecified ear: Secondary | ICD-10-CM | POA: Diagnosis not present

## 2016-11-19 DIAGNOSIS — R197 Diarrhea, unspecified: Secondary | ICD-10-CM | POA: Diagnosis not present

## 2016-12-07 DIAGNOSIS — H04123 Dry eye syndrome of bilateral lacrimal glands: Secondary | ICD-10-CM | POA: Diagnosis not present

## 2017-01-06 DIAGNOSIS — N3001 Acute cystitis with hematuria: Secondary | ICD-10-CM | POA: Diagnosis not present

## 2017-01-06 DIAGNOSIS — N3090 Cystitis, unspecified without hematuria: Secondary | ICD-10-CM | POA: Diagnosis not present

## 2017-01-14 ENCOUNTER — Telehealth (INDEPENDENT_AMBULATORY_CARE_PROVIDER_SITE_OTHER): Payer: Self-pay | Admitting: Orthopaedic Surgery

## 2017-01-14 NOTE — Telephone Encounter (Signed)
Patient called stating her pharmacy faxed over a request to refill her Celebrex before Thanksgiving.  The pharmacy informed her that they have not received anything back from us.  CB#[phone].  Thank you.

## 2017-01-14 NOTE — Telephone Encounter (Signed)
Faxed paper Rx into pharm now

## 2017-01-14 NOTE — Telephone Encounter (Signed)
Called patient, patient aware.

## 2017-01-21 ENCOUNTER — Telehealth (INDEPENDENT_AMBULATORY_CARE_PROVIDER_SITE_OTHER): Payer: Self-pay | Admitting: Orthopaedic Surgery

## 2017-01-21 MED ORDER — CELECOXIB 200 MG PO CAPS: 200.0000 mg | ORAL_CAPSULE | Freq: Two times a day (BID) | ORAL | 3 refills | Status: DC

## 2017-01-21 NOTE — Telephone Encounter (Signed)
Rx now sent to pharm thru chart.

## 2017-01-21 NOTE — Telephone Encounter (Signed)
Patient called saying that her celebrex RX has not been received at her pharmacy and she was asking for it to be resent. CB # [phone]

## 2017-03-19 DIAGNOSIS — Z01419 Encounter for gynecological examination (general) (routine) without abnormal findings: Secondary | ICD-10-CM | POA: Diagnosis not present

## 2017-03-23 DIAGNOSIS — J209 Acute bronchitis, unspecified: Secondary | ICD-10-CM | POA: Diagnosis not present

## 2017-03-23 DIAGNOSIS — J0100 Acute maxillary sinusitis, unspecified: Secondary | ICD-10-CM | POA: Diagnosis not present

## 2017-03-29 DIAGNOSIS — H04123 Dry eye syndrome of bilateral lacrimal glands: Secondary | ICD-10-CM | POA: Diagnosis not present

## 2017-04-14 DIAGNOSIS — J0100 Acute maxillary sinusitis, unspecified: Secondary | ICD-10-CM | POA: Diagnosis not present

## 2017-05-12 DIAGNOSIS — J209 Acute bronchitis, unspecified: Secondary | ICD-10-CM | POA: Diagnosis not present

## 2017-05-12 DIAGNOSIS — J0100 Acute maxillary sinusitis, unspecified: Secondary | ICD-10-CM | POA: Diagnosis not present

## 2017-06-18 DIAGNOSIS — R5383 Other fatigue: Secondary | ICD-10-CM | POA: Diagnosis not present

## 2017-06-18 DIAGNOSIS — G479 Sleep disorder, unspecified: Secondary | ICD-10-CM | POA: Diagnosis not present

## 2017-06-18 DIAGNOSIS — R319 Hematuria, unspecified: Secondary | ICD-10-CM | POA: Diagnosis not present

## 2017-07-02 DIAGNOSIS — J411 Mucopurulent chronic bronchitis: Secondary | ICD-10-CM | POA: Diagnosis not present

## 2017-08-27 DIAGNOSIS — H04123 Dry eye syndrome of bilateral lacrimal glands: Secondary | ICD-10-CM | POA: Diagnosis not present

## 2017-08-27 DIAGNOSIS — H40033 Anatomical narrow angle, bilateral: Secondary | ICD-10-CM | POA: Diagnosis not present

## 2017-10-08 ENCOUNTER — Encounter (INDEPENDENT_AMBULATORY_CARE_PROVIDER_SITE_OTHER): Payer: Self-pay | Admitting: Family Medicine

## 2017-10-08 ENCOUNTER — Ambulatory Visit (INDEPENDENT_AMBULATORY_CARE_PROVIDER_SITE_OTHER): Payer: 59 | Admitting: Family Medicine

## 2017-10-08 VITALS — BP 144/98 | HR 81 | Temp 97.4°F | Ht 65.0 in | Wt 211.5 lb

## 2017-10-08 DIAGNOSIS — I10 Essential (primary) hypertension: Secondary | ICD-10-CM | POA: Diagnosis not present

## 2017-10-08 DIAGNOSIS — F411 Generalized anxiety disorder: Secondary | ICD-10-CM

## 2017-10-08 DIAGNOSIS — F419 Anxiety disorder, unspecified: Secondary | ICD-10-CM | POA: Insufficient documentation

## 2017-10-08 DIAGNOSIS — K760 Fatty (change of) liver, not elsewhere classified: Secondary | ICD-10-CM

## 2017-10-08 DIAGNOSIS — Z0000 Encounter for general adult medical examination without abnormal findings: Secondary | ICD-10-CM | POA: Diagnosis not present

## 2017-10-08 DIAGNOSIS — Z6835 Body mass index (BMI) 35.0-35.9, adult: Secondary | ICD-10-CM

## 2017-10-08 DIAGNOSIS — D580 Hereditary spherocytosis: Secondary | ICD-10-CM | POA: Diagnosis not present

## 2017-10-08 DIAGNOSIS — M2550 Pain in unspecified joint: Secondary | ICD-10-CM

## 2017-10-08 DIAGNOSIS — E669 Obesity, unspecified: Secondary | ICD-10-CM | POA: Insufficient documentation

## 2017-10-08 MED ORDER — LISINOPRIL 10 MG PO TABS: ORAL_TABLET | ORAL | 6 refills | Status: DC

## 2017-10-08 MED ORDER — BUSPIRONE HCL 15 MG PO TABS: 7.5000 mg | ORAL_TABLET | Freq: Two times a day (BID) | ORAL | 6 refills | Status: DC

## 2017-10-08 MED ORDER — CELECOXIB 200 MG PO CAPS: 200.0000 mg | ORAL_CAPSULE | Freq: Two times a day (BID) | ORAL | 6 refills | Status: DC | PRN

## 2017-10-08 MED ORDER — CHOLESTYRAMINE 4 G PO PACK: 4.0000 g | PACK | Freq: Three times a day (TID) | ORAL | 12 refills | Status: DC | PRN

## 2017-10-08 NOTE — Progress Notes (Signed)
Office Visit Note   Patient: Bonnie Gibson           Date of Birth: 09/17/1975           MRN: [id number] Visit Date: 10/08/2017 Requested by: Clovis RileyMitchell, L.August Saucerean, MD [address] [place], [place] [address] PCP: Clovis RileyMitchell, L.August Saucerean, MD  Subjective: Chief Complaint  Patient presents with  . Annual Exam    HPI: She has multiple concerns today.  She has continued to gain weight.  She does not feel like she eats very much.  Weight gain has worsened since hysterectomy.  Upon further questioning she does not eat very healthfully, drinks a lot of soft drinks and eats a lot of sweets.  Hypertension has been suboptimally controlled.  She no longer seems to tolerate propranolol, it makes her nauseated.  She is not on anything for blood pressure right now.  Fatty liver disease seemed to be worse when she had a CT scan last year.  Anxiety is treated with BuSpar and occasional Xanax.  She has multiple joint aches and pains.                ROS: Other systems were negative.  Health Maintenance: Colonoscopy: Not needed Immunizations: Up-to-date Pap/Mammogram: Up-to-date Dentist: Up-to-date Eye: Up-to-date   Objective: Vital Signs: BP (!) 144/98 (BP Location: Left Arm, Patient Position: Sitting, Cuff Size: Normal)   Pulse 81   Temp (!) 97.4 F (36.3 C)   Ht 5\' 5"  (1.651 m)   Wt 211 lb 8 oz (95.9 kg)   LMP 03/25/2016 (Exact Date)   BMI 35.20 kg/m   Physical Exam:  HEENT:  [place]/AT, PERRLA, EOM Full, no nystagmus.  Funduscopic examination within normal limits.  No conjunctival erythema.  Tympanic membranes are pearly gray with normal landmarks.  External ear canals are normal.  Nasal passages are clear.  Oropharynx is clear.  No significant lymphadenopathy.  No thyromegaly or nodules.  2+ carotid pulses without bruits. CV: Regular rate and rhythm without murmurs, rubs, or gallops.  No peripheral edema.  2+ radial and posterior tibial pulses. Lungs: Clear to auscultation  throughout with no wheezing or areas of consolidation. Abd: Bowel sounds are active, no hepatosplenomegaly or masses.  Soft and nontender.  No audible bruits.  No evidence of ascites. EXT: 2+ DTRs. SKIN: No visible rash.    Imaging: None today  Assessment & Plan: 1.  Wellness examination -Labs to evaluate.  2.  Hypertension -Labs to evaluate.  We might try an ACE inhibitor.  3.  Fatty liver disease -We discussed the importance of minimizing intake of high fructose corn syrup and sugars.  4.  Obesity -We will make some dietary changes.  Probably try a food elimination diet.  5.  Multiple joint pain -We will check vitamin D levels.  6.  Anxiety - buspar as needed.   Follow-Up Instructions: No follow-ups on file.     Procedures: None   PMFS History: Patient Active Problem List   Diagnosis Date Noted  . Fatty liver disease, nonalcoholic 10/08/2017  . Hereditary spherocytosis (HCC) 10/08/2017  . Hypertensive disorder 10/08/2017  . Anxiety disorder 10/08/2017  . Obesity 10/08/2017  . Pelvic pain in female 04/02/2016  . 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 Anxiety   . Arthritis   . Complication of anesthesia    hard to wake up post-op  . Elevated white blood cell count    due to spherocytosis; states  is usually 12,000  . Fatty liver   . Headache   . Hereditary spherocytosis (HCC)   . History of hiatal hernia   . Hypertension    has not been taking med.; advised to resume medication as directed today (08/30/2014)  . Impingement syndrome of left shoulder 08/2014  . PONV (postoperative nausea and vomiting)   . Spherocytosis, hereditary (HCC)     History reviewed. No pertinent family history.  Past Surgical History:  Procedure Laterality Date  . BILATERAL SALPINGECTOMY N/A 04/02/2016   Procedure: BILATERAL SALPINGECTOMY;  Surgeon: Levi Aland, MD;  Location: [facility];  Service: Gynecology;  Laterality: N/A;  .  CHOLECYSTECTOMY N/A 03/19/2015   Procedure: LAPAROSCOPIC CHOLECYSTECTOMY WITH INTRAOPERATIVE CHOLANGIOGRAM;  Surgeon: Ovidio Kin, MD;  [facility];  Service: General;  Laterality: N/A;  . COLONOSCOPY    . FINGER SURGERY Left    thumb  . LAPAROSCOPIC ASSISTED VAGINAL HYSTERECTOMY N/A 04/02/2016   Procedure: LAPAROSCOPIC ASSISTED VAGINAL HYSTERECTOMY 120.4g ;  Surgeon: Levi Aland, MD;  Location: [facility];  Service: Gynecology;  Laterality: N/A;  . MYRINGOTOMY    . SHOULDER ARTHROSCOPY Right   . SHOULDER ARTHROSCOPY WITH SUBACROMIAL DECOMPRESSION Left 09/05/2014   Procedure: SHOULDER ARTHROSCOPY WITH SUBACROMIAL DECOMPRESSION;  Surgeon: Tarry Kos, MD;  Location: [facility];  Service: Orthopedics;  Laterality: Left;  . SPLENECTOMY, TOTAL    . TONSILLECTOMY    . TUBAL LIGATION  04/20/2002  . UPPER GI ENDOSCOPY    . WISDOM TOOTH EXTRACTION     Social History   Occupational History  . Not on file  Tobacco Use  . Smoking status: Never Smoker  . Smokeless tobacco: Never Used  Substance and Sexual Activity  . Alcohol use: Yes    Comment: occasionally  . Drug use: No  . Sexual activity: Yes    Birth control/protection: Surgical

## 2017-10-09 LAB — COMPREHENSIVE METABOLIC PANEL
AG RATIO: 1.6 (calc) (ref 1.0–2.5)
ALKALINE PHOSPHATASE (APISO): 48 U/L (ref 33–115)
ALT: 33 U/L — AB (ref 6–29)
AST: 25 U/L (ref 10–30)
Albumin: 4.3 g/dL (ref 3.6–5.1)
BILIRUBIN TOTAL: 1.4 mg/dL — AB (ref 0.2–1.2)
BUN: 10 mg/dL (ref 7–25)
CO2: 26 mmol/L (ref 20–32)
Calcium: 9.6 mg/dL (ref 8.6–10.2)
Chloride: 104 mmol/L (ref 98–110)
Creat: 0.62 mg/dL (ref 0.50–1.10)
Globulin: 2.7 g/dL (calc) (ref 1.9–3.7)
Glucose, Bld: 80 mg/dL (ref 65–99)
POTASSIUM: 4.3 mmol/L (ref 3.5–5.3)
SODIUM: 139 mmol/L (ref 135–146)
Total Protein: 7 g/dL (ref 6.1–8.1)

## 2017-10-09 LAB — CBC WITH DIFFERENTIAL/PLATELET
Basophils Absolute: 118 cells/uL (ref 0–200)
Basophils Relative: 0.8 %
EOS ABS: 237 {cells}/uL (ref 15–500)
Eosinophils Relative: 1.6 %
HEMATOCRIT: 42.4 % (ref 35.0–45.0)
HEMOGLOBIN: 15.4 g/dL (ref 11.7–15.5)
LYMPHS ABS: 3656 {cells}/uL (ref 850–3900)
MCH: 33.7 pg — ABNORMAL HIGH (ref 27.0–33.0)
MCHC: 36.3 g/dL — AB (ref 32.0–36.0)
MCV: 92.8 fL (ref 80.0–100.0)
MPV: 9.7 fL (ref 7.5–12.5)
Monocytes Relative: 9.6 %
NEUTROS ABS: 9368 {cells}/uL — AB (ref 1500–7800)
NEUTROS PCT: 63.3 %
Platelets: 699 10*3/uL — ABNORMAL HIGH (ref 140–400)
RBC: 4.57 10*6/uL (ref 3.80–5.10)
RDW: 12.5 % (ref 11.0–15.0)
Total Lymphocyte: 24.7 %
WBC mixed population: 1421 cells/uL — ABNORMAL HIGH (ref 200–950)
WBC: 14.8 10*3/uL — ABNORMAL HIGH (ref 3.8–10.8)

## 2017-10-09 LAB — LIPID PANEL
Cholesterol: 212 mg/dL — ABNORMAL HIGH (ref <200)
HDL: 57 mg/dL (ref >50)
LDL CHOLESTEROL (CALC): 114 mg/dL — AB
Non-HDL Cholesterol (Calc): 155 mg/dL (calc) — ABNORMAL HIGH (ref <130)
TRIGLYCERIDES: 288 mg/dL — AB (ref <150)
Total CHOL/HDL Ratio: 3.7 (calc) (ref <5.0)

## 2017-10-09 LAB — HEMOGLOBIN A1C
EAG (MMOL/L): 5.2 (calc)
Hgb A1c MFr Bld: 4.9 % of total Hgb (ref <5.7)
Mean Plasma Glucose: 94 (calc)

## 2017-10-09 LAB — THYROID PANEL WITH TSH
Free Thyroxine Index: 2.4 (ref 1.4–3.8)
T3 UPTAKE: 23 % (ref 22–35)
T4 TOTAL: 10.5 ug/dL (ref 5.1–11.9)
TSH: 0.81 mIU/L

## 2017-10-09 LAB — VITAMIN D 25 HYDROXY (VIT D DEFICIENCY, FRACTURES): VIT D 25 HYDROXY: 35 ng/mL (ref 30–100)

## 2017-10-09 LAB — GAMMA GT: GGT: 14 U/L (ref 3–55)

## 2017-10-09 LAB — HIGH SENSITIVITY CRP: HS-CRP: 3.8 mg/L — AB

## 2017-10-11 ENCOUNTER — Other Ambulatory Visit (INDEPENDENT_AMBULATORY_CARE_PROVIDER_SITE_OTHER): Payer: Self-pay | Admitting: Family Medicine

## 2017-10-11 ENCOUNTER — Telehealth (INDEPENDENT_AMBULATORY_CARE_PROVIDER_SITE_OTHER): Payer: Self-pay | Admitting: Family Medicine

## 2017-10-11 DIAGNOSIS — R5382 Chronic fatigue, unspecified: Secondary | ICD-10-CM

## 2017-10-11 DIAGNOSIS — Z9081 Acquired absence of spleen: Secondary | ICD-10-CM | POA: Insufficient documentation

## 2017-10-11 MED ORDER — PHENTERMINE HCL 30 MG PO CAPS: 30.0000 mg | ORAL_CAPSULE | ORAL | 3 refills | Status: DC

## 2017-10-11 NOTE — Telephone Encounter (Signed)
Labs show:  C-reactive protein, inflammation marker, is elevated at 3.8.  This is a nonspecific test and can be elevated for a variety of reasons, but is considered a risk factor for development of heart disease.  In general, it can be improved with regular exercise and dietary limitation of processed carbohydrates and sweets.  We should recheck in 6-12 months.  Triglycerides are elevated at 288 compared to HDL of 57.  When they're more than double the HDL, there's a much higher risk of becoming diabetic.  Lifestyle changes above should help.  CBC has abnormalities, including WBC of 14.8 and platelets of 699.  In the past these have been elevated during hospital admissions, but I believe it was attributed to acute illness.  Now that you are not acutely sick but they are abnormal anyway, it is concerning that something else might be going on.  It might be worthwhile to consult with a hematologist for additional evaluation.  If you are in agreement, I will make the referral.  [facility]

## 2017-10-11 NOTE — Addendum Note (Signed)
Addended by: Lillia CarmelHILTS, Oyindamola Key J on: 10/11/2017 10:54 AM   Modules accepted: Orders

## 2017-10-11 NOTE — Addendum Note (Signed)
Addended by: Lillia CarmelHILTS, Emelia Sandoval J on: 10/11/2017 06:10 AM   Modules accepted: Orders

## 2017-11-22 ENCOUNTER — Encounter (INDEPENDENT_AMBULATORY_CARE_PROVIDER_SITE_OTHER): Payer: Self-pay | Admitting: Family Medicine

## 2017-11-22 ENCOUNTER — Other Ambulatory Visit (INDEPENDENT_AMBULATORY_CARE_PROVIDER_SITE_OTHER): Payer: Self-pay | Admitting: Family Medicine

## 2017-11-22 MED ORDER — ONDANSETRON HCL 4 MG PO TABS: 4.0000 mg | ORAL_TABLET | Freq: Three times a day (TID) | ORAL | 3 refills | Status: DC | PRN

## 2017-11-23 ENCOUNTER — Other Ambulatory Visit (INDEPENDENT_AMBULATORY_CARE_PROVIDER_SITE_OTHER): Payer: Self-pay | Admitting: Family Medicine

## 2017-11-23 ENCOUNTER — Encounter (INDEPENDENT_AMBULATORY_CARE_PROVIDER_SITE_OTHER): Payer: Self-pay | Admitting: Family Medicine

## 2017-11-23 MED ORDER — CELECOXIB 200 MG PO CAPS: 200.0000 mg | ORAL_CAPSULE | Freq: Two times a day (BID) | ORAL | 6 refills | Status: DC | PRN

## 2017-12-06 ENCOUNTER — Encounter (INDEPENDENT_AMBULATORY_CARE_PROVIDER_SITE_OTHER): Payer: Self-pay | Admitting: Family Medicine

## 2017-12-06 DIAGNOSIS — J029 Acute pharyngitis, unspecified: Secondary | ICD-10-CM | POA: Diagnosis not present

## 2017-12-14 ENCOUNTER — Other Ambulatory Visit (INDEPENDENT_AMBULATORY_CARE_PROVIDER_SITE_OTHER): Payer: Self-pay | Admitting: Family Medicine

## 2017-12-14 ENCOUNTER — Encounter (INDEPENDENT_AMBULATORY_CARE_PROVIDER_SITE_OTHER): Payer: Self-pay | Admitting: Family Medicine

## 2017-12-15 ENCOUNTER — Other Ambulatory Visit (INDEPENDENT_AMBULATORY_CARE_PROVIDER_SITE_OTHER): Payer: Self-pay | Admitting: Family Medicine

## 2017-12-15 MED ORDER — AZITHROMYCIN 250 MG PO TABS: ORAL_TABLET | ORAL | 0 refills | Status: DC

## 2017-12-21 ENCOUNTER — Other Ambulatory Visit (INDEPENDENT_AMBULATORY_CARE_PROVIDER_SITE_OTHER): Payer: Self-pay | Admitting: Family Medicine

## 2017-12-21 ENCOUNTER — Encounter (INDEPENDENT_AMBULATORY_CARE_PROVIDER_SITE_OTHER): Payer: Self-pay | Admitting: Family Medicine

## 2017-12-21 MED ORDER — LISINOPRIL 10 MG PO TABS: ORAL_TABLET | ORAL | 6 refills | Status: DC

## 2017-12-21 MED ORDER — CELECOXIB 200 MG PO CAPS: 200.0000 mg | ORAL_CAPSULE | Freq: Two times a day (BID) | ORAL | 6 refills | Status: DC | PRN

## 2018-01-12 ENCOUNTER — Other Ambulatory Visit (INDEPENDENT_AMBULATORY_CARE_PROVIDER_SITE_OTHER): Payer: Self-pay | Admitting: Family Medicine

## 2018-01-12 ENCOUNTER — Telehealth (INDEPENDENT_AMBULATORY_CARE_PROVIDER_SITE_OTHER): Payer: Self-pay | Admitting: Family Medicine

## 2018-01-12 MED ORDER — ALPRAZOLAM 0.5 MG PO TABS: 0.5000 mg | ORAL_TABLET | Freq: Every evening | ORAL | 0 refills | Status: DC | PRN

## 2018-01-12 NOTE — Telephone Encounter (Signed)
Advised patient

## 2018-01-12 NOTE — Telephone Encounter (Signed)
Patient called requesting an Rx refill on her Xanex.  She did not realize that she is out and is leaving on vacation tomorrow.  CB#[phone].  Thank you.

## 2018-01-12 NOTE — Telephone Encounter (Signed)
Rx sent 

## 2018-01-12 NOTE — Telephone Encounter (Signed)
Please advise 

## 2018-01-20 ENCOUNTER — Other Ambulatory Visit (INDEPENDENT_AMBULATORY_CARE_PROVIDER_SITE_OTHER): Payer: Self-pay | Admitting: Family Medicine

## 2018-01-20 ENCOUNTER — Encounter (INDEPENDENT_AMBULATORY_CARE_PROVIDER_SITE_OTHER): Payer: Self-pay | Admitting: Family Medicine

## 2018-01-20 MED ORDER — BENZONATATE 100 MG PO CAPS: 100.0000 mg | ORAL_CAPSULE | Freq: Three times a day (TID) | ORAL | 1 refills | Status: DC | PRN

## 2018-02-15 ENCOUNTER — Encounter (INDEPENDENT_AMBULATORY_CARE_PROVIDER_SITE_OTHER): Payer: Self-pay | Admitting: Family Medicine

## 2018-02-15 MED ORDER — DICLOFENAC SODIUM 1 % TD GEL: 4.0000 g | Freq: Four times a day (QID) | TRANSDERMAL | 6 refills | Status: DC | PRN

## 2018-02-21 ENCOUNTER — Encounter (INDEPENDENT_AMBULATORY_CARE_PROVIDER_SITE_OTHER): Payer: Self-pay | Admitting: Family Medicine

## 2018-02-21 MED ORDER — SULFAMETHOXAZOLE-TRIMETHOPRIM 800-160 MG PO TABS: 1.0000 | ORAL_TABLET | Freq: Two times a day (BID) | ORAL | 0 refills | Status: DC

## 2018-03-10 ENCOUNTER — Encounter (INDEPENDENT_AMBULATORY_CARE_PROVIDER_SITE_OTHER): Payer: Self-pay | Admitting: Family Medicine

## 2018-03-10 MED ORDER — CHLORPHEN-PE-ACETAMINOPHEN 4-10-325 MG PO TABS: 2.0000 | ORAL_TABLET | ORAL | 3 refills | Status: DC | PRN

## 2018-03-16 ENCOUNTER — Encounter (INDEPENDENT_AMBULATORY_CARE_PROVIDER_SITE_OTHER): Payer: Self-pay | Admitting: Family Medicine

## 2018-03-16 ENCOUNTER — Ambulatory Visit (INDEPENDENT_AMBULATORY_CARE_PROVIDER_SITE_OTHER): Payer: 59 | Admitting: Family Medicine

## 2018-03-16 VITALS — BP 140/92 | HR 78 | Temp 97.8°F

## 2018-03-16 DIAGNOSIS — R42 Dizziness and giddiness: Secondary | ICD-10-CM | POA: Diagnosis not present

## 2018-03-16 DIAGNOSIS — H8110 Benign paroxysmal vertigo, unspecified ear: Secondary | ICD-10-CM | POA: Diagnosis not present

## 2018-03-16 NOTE — Progress Notes (Signed)
Office Visit Note   Patient: Bonnie Gibson           Date of Birth: 1975/04/11           MRN: [id number] Visit Date: 03/16/2018 Requested by: Clovis Riley, L.August Saucer, MD [address] [place], [place] [address] PCP: Clovis Riley, L.August Saucer, MD  Subjective: Chief Complaint  Patient presents with  . Dizziness  . Nausea  . numbness in left hand    HPI: She is here with vertigo.  Symptoms started about 4 or 5 days ago.  Intermittent symptoms, this morning when she bent forward it came on severely.  Normally it bothers her the most when she is supine and on her left side.  In the past she has had extensive ENT work-up.  Although certain positions trigger her symptoms, she never has nystagmus during the examinations.  She manages her flareups with antinausea medicines over-the-counter.  She has not been sick lately, no fevers or chills or congestion.  Her blood pressure has been slightly elevated.              ROS: Otherwise noncontributory  Objective: Vital Signs: BP (!) 140/92 (BP Location: Left Arm, Patient Position: Sitting, Cuff Size: Normal)   Pulse 78   Temp 97.8 F (36.6 C)   LMP 03/25/2016 (Exact Date)   Physical Exam:  HEENT:  [place]/AT, PERRLA, EOM Full, no nystagmus.  Funduscopic examination within normal limits.  No conjunctival erythema.  Tympanic membranes are pearly gray with normal landmarks.  External ear canals are normal.  Nasal passages are clear.  Oropharynx is clear.  No significant lymphadenopathy.  No thyromegaly or nodules.  2+ carotid pulses without bruits. CV: Regular rate and rhythm without murmurs, rubs, or gallops.  No peripheral edema.  2+ radial and posterior tibial pulses. Lungs: Clear to auscultation throughout with no wheezing or areas of consolidation.    Imaging: None today.  Assessment & Plan: 1.  Suspect benign positional vertigo -Physical therapy referral, I called and got her an appointment at 3:00 today..  Follow-up as needed.   Follow-Up  Instructions: No follow-ups on file.      Procedures: No procedures performed  No notes on file    PMFS History: Patient Active Problem List   Diagnosis Date Noted  . History of splenectomy 10/11/2017  . Fatty liver disease, nonalcoholic 10/08/2017  . Hereditary spherocytosis (HCC) 10/08/2017  . Hypertensive disorder 10/08/2017  . Anxiety disorder 10/08/2017  . Obesity 10/08/2017  . Pelvic pain in female 04/02/2016  . 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 Anxiety   . Arthritis   . Complication of anesthesia    hard to wake up post-op  . Elevated white blood cell count    due to spherocytosis; states is usually 12,000  . Fatty liver   . Headache   . Hereditary spherocytosis (HCC)   . History of hiatal hernia   . Hypertension    has not been taking med.; advised to resume medication as directed today (08/30/2014)  . Impingement syndrome of left shoulder 08/2014  . PONV (postoperative nausea and vomiting)   . Spherocytosis, hereditary (HCC)     History reviewed. No pertinent family history.  Past Surgical History:  Procedure Laterality Date  . BILATERAL SALPINGECTOMY N/A 04/02/2016   Procedure: BILATERAL SALPINGECTOMY;  Surgeon: Levi Aland, MD;  Location: [facility];  Service: Gynecology;  Laterality: N/A;  . CHOLECYSTECTOMY N/A 03/19/2015   Procedure:  LAPAROSCOPIC CHOLECYSTECTOMY WITH INTRAOPERATIVE CHOLANGIOGRAM;  Surgeon: Ovidio Kin, MD;  Location: [facility] OR;  Service: General;  Laterality: N/A;  . COLONOSCOPY    . FINGER SURGERY Left    thumb  . LAPAROSCOPIC ASSISTED VAGINAL HYSTERECTOMY N/A 04/02/2016   Procedure: LAPAROSCOPIC ASSISTED VAGINAL HYSTERECTOMY 120.4g ;  Surgeon: Levi Aland, MD;  Location: [facility];  Service: Gynecology;  Laterality: N/A;  . MYRINGOTOMY    . SHOULDER ARTHROSCOPY Right   . SHOULDER ARTHROSCOPY WITH SUBACROMIAL DECOMPRESSION Left 09/05/2014   Procedure: SHOULDER ARTHROSCOPY WITH SUBACROMIAL  DECOMPRESSION;  Surgeon: Tarry Kos, MD;  Location: [facility];  Service: Orthopedics;  Laterality: Left;  . SPLENECTOMY, TOTAL    . TONSILLECTOMY    . TUBAL LIGATION  04/20/2002  . UPPER GI ENDOSCOPY    . WISDOM TOOTH EXTRACTION     Social History   Occupational History  . Not on file  Tobacco Use  . Smoking status: Never Smoker  . Smokeless tobacco: Never Used  Substance and Sexual Activity  . Alcohol use: Yes    Comment: occasionally  . Drug use: No  . Sexual activity: Yes    Birth control/protection: Surgical

## 2018-04-21 DIAGNOSIS — J069 Acute upper respiratory infection, unspecified: Secondary | ICD-10-CM | POA: Diagnosis not present

## 2018-04-25 ENCOUNTER — Encounter (INDEPENDENT_AMBULATORY_CARE_PROVIDER_SITE_OTHER): Payer: Self-pay | Admitting: Family Medicine

## 2018-04-29 MED ORDER — ALPRAZOLAM 0.5 MG PO TABS: 0.5000 mg | ORAL_TABLET | Freq: Every evening | ORAL | 0 refills | Status: DC | PRN

## 2018-05-12 ENCOUNTER — Encounter (INDEPENDENT_AMBULATORY_CARE_PROVIDER_SITE_OTHER): Payer: Self-pay | Admitting: Family Medicine

## 2018-05-16 ENCOUNTER — Encounter (INDEPENDENT_AMBULATORY_CARE_PROVIDER_SITE_OTHER): Payer: Self-pay | Admitting: Family Medicine

## 2018-07-15 ENCOUNTER — Encounter (INDEPENDENT_AMBULATORY_CARE_PROVIDER_SITE_OTHER): Payer: Self-pay | Admitting: Family Medicine

## 2018-07-21 ENCOUNTER — Encounter (INDEPENDENT_AMBULATORY_CARE_PROVIDER_SITE_OTHER): Payer: Self-pay | Admitting: Family Medicine

## 2018-08-15 ENCOUNTER — Encounter (INDEPENDENT_AMBULATORY_CARE_PROVIDER_SITE_OTHER): Payer: Self-pay | Admitting: Family Medicine

## 2018-08-16 ENCOUNTER — Encounter (INDEPENDENT_AMBULATORY_CARE_PROVIDER_SITE_OTHER): Payer: Self-pay | Admitting: Family Medicine

## 2018-08-16 MED ORDER — DIETHYLPROPION HCL 25 MG PO TABS: 25.0000 mg | ORAL_TABLET | Freq: Three times a day (TID) | ORAL | 3 refills | Status: DC | PRN

## 2018-08-29 ENCOUNTER — Encounter (INDEPENDENT_AMBULATORY_CARE_PROVIDER_SITE_OTHER): Payer: Self-pay | Admitting: Family Medicine

## 2018-08-29 MED ORDER — PHENTERMINE HCL 30 MG PO CAPS: 30.0000 mg | ORAL_CAPSULE | ORAL | 3 refills | Status: DC

## 2018-09-08 ENCOUNTER — Encounter (INDEPENDENT_AMBULATORY_CARE_PROVIDER_SITE_OTHER): Payer: Self-pay | Admitting: Family Medicine

## 2018-09-22 ENCOUNTER — Encounter (INDEPENDENT_AMBULATORY_CARE_PROVIDER_SITE_OTHER): Payer: Self-pay | Admitting: Family Medicine

## 2018-09-29 ENCOUNTER — Other Ambulatory Visit: Payer: Self-pay | Admitting: Obstetrics and Gynecology

## 2018-09-29 DIAGNOSIS — Z1231 Encounter for screening mammogram for malignant neoplasm of breast: Secondary | ICD-10-CM

## 2018-10-04 ENCOUNTER — Encounter (INDEPENDENT_AMBULATORY_CARE_PROVIDER_SITE_OTHER): Payer: Self-pay | Admitting: Family Medicine

## 2018-10-10 ENCOUNTER — Encounter (INDEPENDENT_AMBULATORY_CARE_PROVIDER_SITE_OTHER): Payer: Self-pay | Admitting: Family Medicine

## 2018-10-10 MED ORDER — ONDANSETRON HCL 4 MG PO TABS: 4.0000 mg | ORAL_TABLET | Freq: Three times a day (TID) | ORAL | 3 refills | Status: DC | PRN

## 2018-11-15 ENCOUNTER — Encounter (INDEPENDENT_AMBULATORY_CARE_PROVIDER_SITE_OTHER): Payer: Self-pay | Admitting: Family Medicine

## 2018-11-28 ENCOUNTER — Encounter (INDEPENDENT_AMBULATORY_CARE_PROVIDER_SITE_OTHER): Payer: Self-pay | Admitting: Family Medicine

## 2018-11-28 MED ORDER — SULFAMETHOXAZOLE-TRIMETHOPRIM 800-160 MG PO TABS: 1.0000 | ORAL_TABLET | Freq: Two times a day (BID) | ORAL | 0 refills | Status: DC

## 2018-11-29 ENCOUNTER — Other Ambulatory Visit: Payer: Self-pay | Admitting: Obstetrics and Gynecology

## 2018-11-29 DIAGNOSIS — Z1231 Encounter for screening mammogram for malignant neoplasm of breast: Secondary | ICD-10-CM

## 2018-11-30 ENCOUNTER — Encounter (INDEPENDENT_AMBULATORY_CARE_PROVIDER_SITE_OTHER): Payer: Self-pay | Admitting: Family Medicine

## 2018-12-05 ENCOUNTER — Encounter (INDEPENDENT_AMBULATORY_CARE_PROVIDER_SITE_OTHER): Payer: Self-pay | Admitting: Family Medicine

## 2018-12-05 MED ORDER — LISINOPRIL 10 MG PO TABS: ORAL_TABLET | ORAL | 6 refills | Status: DC

## 2018-12-05 MED ORDER — ALPRAZOLAM 0.5 MG PO TABS: 0.5000 mg | ORAL_TABLET | Freq: Every evening | ORAL | 0 refills | Status: DC | PRN

## 2018-12-11 ENCOUNTER — Encounter (INDEPENDENT_AMBULATORY_CARE_PROVIDER_SITE_OTHER): Payer: Self-pay | Admitting: Family Medicine

## 2018-12-26 ENCOUNTER — Ambulatory Visit (INDEPENDENT_AMBULATORY_CARE_PROVIDER_SITE_OTHER): Payer: 59 | Admitting: Family Medicine

## 2018-12-26 ENCOUNTER — Encounter: Payer: Self-pay | Admitting: Family Medicine

## 2018-12-26 ENCOUNTER — Ambulatory Visit: Payer: Self-pay

## 2018-12-26 ENCOUNTER — Other Ambulatory Visit: Payer: Self-pay

## 2018-12-26 DIAGNOSIS — M79661 Pain in right lower leg: Secondary | ICD-10-CM | POA: Diagnosis not present

## 2018-12-26 NOTE — Progress Notes (Signed)
Office Visit Note   Patient: Bonnie Gibson           Date of Birth: Dec 13, 1975           MRN: [id number] Visit Date: 12/26/2018 Requested by: Clovis Riley, L.August Saucer, MD [address] [place],  [place] [address] PCP: Clovis Riley, L.August Saucer, MD  Subjective: Chief Complaint  Patient presents with  . Right Lower Leg - Pain    Pain anterolateral lateral lower leg (proximal) since getting hit with a softball. Started with a big bruise - it went away, but pain has remained. Hurts to flex knee. Since this weekend, it has hurt to walk.    HPI: She is here with right lower leg pain.  In September she was playing softball and was hit in the leg with a ball just below the knee, on the anterior lateral aspect.  There is a large bruise but she was able to walk so she presumed that it was just a contusion that would heal with time.  She continues to have pain with flexing and extending the knee, or when transitioning from sitting to standing.  Recently she was at the beach and she fell, and her knee has been hurting significantly since then.              ROS: No fevers/chills.  All other systems were reviewed and are negative.  Objective: Vital Signs: LMP 03/25/2016 (Exact Date)   Physical Exam:  General:  Alert and oriented, in no acute distress. Pulm:  Breathing unlabored. Psy:  Normal mood, congruent affect. Skin:  No rash or bruising.  Leg: He has pain with passive flexion and extension of her knee.  No knee effusion and no tenderness around the knee.  No tenderness along the tibia.  Very tender near the fibular head and anterior to that.  She has pain with ankle dorsiflexion and eversion against resistance but her strength is intact.  Imaging: X-rays right tibia/fibula:  No fracture, no soft tissue calcifications seen.    Assessment & Plan: 1.  Almost 2 months s/p right leg contusion - Crutches as needed.  Voltaren gel.  [place] PT Rx given. - Ultrasound if fails to improve.      Procedures: No procedures performed  No notes on file     PMFS History: Patient Active Problem List   Diagnosis Date Noted  . History of splenectomy 10/11/2017  . Fatty liver disease, nonalcoholic 10/08/2017  . Hereditary spherocytosis (HCC) 10/08/2017  . Hypertensive disorder 10/08/2017  . Anxiety disorder 10/08/2017  . Obesity 10/08/2017  . Pelvic pain in female 04/02/2016  . 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 Anxiety   . Arthritis   . Complication of anesthesia    hard to wake up post-op  . Elevated white blood cell count    due to spherocytosis; states is usually 12,000  . Fatty liver   . Headache   . Hereditary spherocytosis (HCC)   . History of hiatal hernia   . Hypertension    has not been taking med.; advised to resume medication as directed today (08/30/2014)  . Impingement syndrome of left shoulder 08/2014  . PONV (postoperative nausea and vomiting)   . Spherocytosis, hereditary (HCC)     History reviewed. No pertinent family history.  Past Surgical History:  Procedure Laterality Date  . BILATERAL SALPINGECTOMY N/A 04/02/2016   Procedure: BILATERAL SALPINGECTOMY;  Surgeon: Levi Aland, MD;  Location: [facility];  Service: Gynecology;  Laterality: N/A;  . CHOLECYSTECTOMY N/A 03/19/2015   Procedure: LAPAROSCOPIC CHOLECYSTECTOMY WITH INTRAOPERATIVE CHOLANGIOGRAM;  Surgeon: Alphonsa Overall, MD;  Location: [facility];  Service: General;  Laterality: N/A;  . COLONOSCOPY    . FINGER SURGERY Left    thumb  . LAPAROSCOPIC ASSISTED VAGINAL HYSTERECTOMY N/A 04/02/2016   Procedure: LAPAROSCOPIC ASSISTED VAGINAL HYSTERECTOMY 120.4g ;  Surgeon: Olga Millers, MD;  Location: [facility];  Service: Gynecology;  Laterality: N/A;  . MYRINGOTOMY    . SHOULDER ARTHROSCOPY Right   . SHOULDER ARTHROSCOPY WITH SUBACROMIAL DECOMPRESSION Left 09/05/2014   Procedure: SHOULDER ARTHROSCOPY WITH SUBACROMIAL DECOMPRESSION;  Surgeon: Leandrew Koyanagi, MD;  Location:  [facility];  Service: Orthopedics;  Laterality: Left;  . SPLENECTOMY, TOTAL    . TONSILLECTOMY    . TUBAL LIGATION  04/20/2002  . UPPER GI ENDOSCOPY    . WISDOM TOOTH EXTRACTION     Social History   Occupational History  . Not on file  Tobacco Use  . Smoking status: Never Smoker  . Smokeless tobacco: Never Used  Substance and Sexual Activity  . Alcohol use: Yes    Comment: occasionally  . Drug use: No  . Sexual activity: Yes    Birth control/protection: Surgical

## 2019-01-02 ENCOUNTER — Encounter: Payer: Self-pay | Admitting: Family Medicine

## 2019-01-10 ENCOUNTER — Encounter: Payer: Self-pay | Admitting: Family Medicine

## 2019-02-14 ENCOUNTER — Other Ambulatory Visit (INDEPENDENT_AMBULATORY_CARE_PROVIDER_SITE_OTHER): Payer: Self-pay | Admitting: Family Medicine

## 2019-02-15 ENCOUNTER — Other Ambulatory Visit: Payer: Self-pay | Admitting: Family Medicine

## 2019-02-15 ENCOUNTER — Encounter: Payer: Self-pay | Admitting: Family Medicine

## 2019-03-07 ENCOUNTER — Encounter: Payer: Self-pay | Admitting: Family Medicine

## 2019-03-07 DIAGNOSIS — Z0000 Encounter for general adult medical examination without abnormal findings: Secondary | ICD-10-CM

## 2019-03-14 ENCOUNTER — Encounter: Payer: Self-pay | Admitting: Family Medicine

## 2019-03-14 MED ORDER — NITROFURANTOIN MONOHYD MACRO 100 MG PO CAPS: 100.0000 mg | ORAL_CAPSULE | Freq: Two times a day (BID) | ORAL | 0 refills | Status: DC

## 2019-03-27 ENCOUNTER — Other Ambulatory Visit (INDEPENDENT_AMBULATORY_CARE_PROVIDER_SITE_OTHER): Payer: Self-pay | Admitting: Family Medicine

## 2019-03-27 ENCOUNTER — Encounter: Payer: Self-pay | Admitting: Family Medicine

## 2019-04-03 ENCOUNTER — Encounter: Payer: Self-pay | Admitting: Family Medicine

## 2019-04-10 ENCOUNTER — Encounter: Payer: Self-pay | Admitting: Family Medicine

## 2019-04-10 MED ORDER — POLYETHYLENE GLYCOL 3350 17 GM/SCOOP PO POWD: 1.0000 | Freq: Three times a day (TID) | ORAL | 0 refills | Status: DC | PRN

## 2019-04-17 ENCOUNTER — Encounter: Payer: 59 | Admitting: Family Medicine

## 2019-04-21 ENCOUNTER — Encounter: Payer: Self-pay | Admitting: Family Medicine

## 2019-04-21 ENCOUNTER — Other Ambulatory Visit: Payer: Self-pay

## 2019-04-21 ENCOUNTER — Ambulatory Visit: Payer: 59 | Admitting: Family Medicine

## 2019-04-21 VITALS — BP 125/84 | HR 90 | Resp 16 | Ht 64.5 in | Wt 187.8 lb

## 2019-04-21 DIAGNOSIS — G8929 Other chronic pain: Secondary | ICD-10-CM

## 2019-04-21 DIAGNOSIS — D580 Hereditary spherocytosis: Secondary | ICD-10-CM

## 2019-04-21 DIAGNOSIS — R7982 Elevated C-reactive protein (CRP): Secondary | ICD-10-CM

## 2019-04-21 DIAGNOSIS — F5102 Adjustment insomnia: Secondary | ICD-10-CM

## 2019-04-21 DIAGNOSIS — F411 Generalized anxiety disorder: Secondary | ICD-10-CM | POA: Diagnosis not present

## 2019-04-21 DIAGNOSIS — E669 Obesity, unspecified: Secondary | ICD-10-CM

## 2019-04-21 DIAGNOSIS — Z0000 Encounter for general adult medical examination without abnormal findings: Secondary | ICD-10-CM | POA: Diagnosis not present

## 2019-04-21 DIAGNOSIS — K760 Fatty (change of) liver, not elsewhere classified: Secondary | ICD-10-CM | POA: Diagnosis not present

## 2019-04-21 DIAGNOSIS — E785 Hyperlipidemia, unspecified: Secondary | ICD-10-CM | POA: Insufficient documentation

## 2019-04-21 DIAGNOSIS — I10 Essential (primary) hypertension: Secondary | ICD-10-CM

## 2019-04-21 DIAGNOSIS — M25511 Pain in right shoulder: Secondary | ICD-10-CM

## 2019-04-21 DIAGNOSIS — M25512 Pain in left shoulder: Secondary | ICD-10-CM

## 2019-04-21 DIAGNOSIS — R200 Anesthesia of skin: Secondary | ICD-10-CM

## 2019-04-21 NOTE — Progress Notes (Signed)
Office Visit Note   Patient: Bonnie Gibson           Date of Birth: November 22, 1975           MRN: [id number] Visit Date: 04/21/2019 Requested by: Clovis Riley, L.August Saucer, MD [address] [place],  [place] [address] PCP: Clovis Riley, L.August Saucer, MD  Subjective: Chief Complaint  Patient presents with  . Annual Exam    HPI: She is here for a wellness exam.  She saw her gynecologist recently who ordered hormone labs which were reportedly normal.  He also checked her thyroid which was also in normal range.  She continues to complain of irritability.  She thinks it is partly due to poor sleep.  She has been taking Xanax more frequently to help her sleep, but she does not want to do that.  She wondered whether trazodone might help.  Her recent constipation issues seem to have subsided.  She has been having increasing troubles with both shoulders lately.  She has had surgery on both sides.  Lately they bother her at night, waking her from sleep and she also describes numbness and tingling in her hands.  Symptoms are much better during the day.  She has a history of hypertension which has been under good control.  She has fatty liver disease which has been stable.  She has lost 30 pounds this past year.  She has hereditary spherocytosis and is status post splenectomy.  She has hyperlipidemia managed with medication.   From a health maintenance standpoint she is up-to-date with dental exams and eye exams.  She had her recent gynecology exam.  She has had a mammogram several years ago.               ROS:   All other systems were reviewed and are negative.  Objective: Vital Signs: BP 125/84   Pulse 90   Resp 16   Ht 5' 4.5" (1.638 m)   Wt 187 lb 12.8 oz (85.2 kg)   LMP 03/25/2016 (Exact Date)   BMI 31.74 kg/m   Physical Exam:  General:  Alert and oriented, in no acute distress. Pulm:  Breathing unlabored. Psy:  Normal mood, congruent affect. Skin: No suspicious lesions. HEENT:   [place]/AT, PERRLA, EOM Full, no nystagmus.  Funduscopic examination within normal limits.  No conjunctival erythema.  Tympanic membranes are pearly gray with normal landmarks.  External ear canals are normal.  Nasal passages are clear.  Oropharynx is clear.  No significant lymphadenopathy.  No thyromegaly or nodules.  2+ carotid pulses without bruits. CV: Regular rate and rhythm without murmurs, rubs, or gallops.  No peripheral edema.  2+ radial and posterior tibial pulses. Lungs: Clear to auscultation throughout with no wheezing or areas of consolidation. Abd: Bowel sounds are active, no hepatosplenomegaly or masses.  Soft and nontender.  No audible bruits.  No evidence of ascites. Arms: She has positive Tinel's at the carpal tunnel bilaterally and positive Phalen's test right greater than left.  No thenar atrophy.   Imaging: None today  Assessment & Plan: 1.  Wellness examination -Labs to evaluate.  2.  Chronic bilateral shoulder pain -Rotator cuff exercises at home.  3.  Bilateral hand numbness, suspect carpal tunnel syndrome -Night splints for 6 to 8 weeks.  Nerve studies if symptoms persist.  4.  Insomnia -Trial of theanine, phosphatidylserine. -Trazodone if symptoms persist, although with recent constipation troubles we will need to be cautious with this.  5.  Hypertension, well controlled.  6.  Hyperlipidemia  7.  Elevated C-reactive protein -We will check thyroid antibodies along with this since she is having some thyroid related symptoms.     Procedures: No procedures performed  No notes on file     PMFS History: Patient Active Problem List   Diagnosis Date Noted  . Hyperlipidemia 04/21/2019  . History of splenectomy 10/11/2017  . Fatty liver disease, nonalcoholic 82/95/6213  . Hereditary spherocytosis ([place]) 10/08/2017  . Hypertensive disorder 10/08/2017  . Anxiety disorder 10/08/2017  . Obesity (BMI 30.0-34.9) 10/08/2017  . Pelvic pain in female 04/02/2016  .  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 Anxiety   . Arthritis   . Complication of anesthesia    hard to wake up post-op  . Elevated white blood cell count    due to spherocytosis; states is usually 12,000  . Fatty liver   . Headache   . Hereditary spherocytosis ([place])   . History of hiatal hernia   . Hypertension    has not been taking med.; advised to resume medication as directed today (08/30/2014)  . Impingement syndrome of left shoulder 08/2014  . PONV (postoperative nausea and vomiting)   . Spherocytosis, hereditary ([place])     Family History  Problem Relation Age of Onset  . Heart defect Mother        MVP  . Aneurysm Mother        Brain  . Hypertension Mother   . Diabetes Mother   . Heart disease Father        CABG x 3  . Hypertension Father   . Cirrhosis Maternal Aunt   . Diabetes Maternal Grandmother   . Heart disease Paternal Grandfather        Needed pacemaker  . Thyroid disease Neg Hx     Past Surgical History:  Procedure Laterality Date  . BILATERAL SALPINGECTOMY N/A 04/02/2016   Procedure: BILATERAL SALPINGECTOMY;  Surgeon: Olga Millers, MD;  Location: [facility];  Service: Gynecology;  Laterality: N/A;  . CHOLECYSTECTOMY N/A 03/19/2015   Procedure: LAPAROSCOPIC CHOLECYSTECTOMY WITH INTRAOPERATIVE CHOLANGIOGRAM;  Surgeon: Alphonsa Overall, MD;  Location: [facility];  Service: General;  Laterality: N/A;  . COLONOSCOPY    . FINGER SURGERY Left    thumb  . LAPAROSCOPIC ASSISTED VAGINAL HYSTERECTOMY N/A 04/02/2016   Procedure: LAPAROSCOPIC ASSISTED VAGINAL HYSTERECTOMY 120.4g ;  Surgeon: Olga Millers, MD;  Location: [facility];  Service: Gynecology;  Laterality: N/A;  . MYRINGOTOMY    . SHOULDER ARTHROSCOPY Right   . SHOULDER ARTHROSCOPY WITH SUBACROMIAL DECOMPRESSION Left 09/05/2014   Procedure: SHOULDER ARTHROSCOPY WITH SUBACROMIAL DECOMPRESSION;  Surgeon: Leandrew Koyanagi, MD;  Location: [facility];  Service: Orthopedics;   Laterality: Left;  . SPLENECTOMY, TOTAL    . TONSILLECTOMY    . TUBAL LIGATION  04/20/2002  . UPPER GI ENDOSCOPY    . WISDOM TOOTH EXTRACTION     Social History   Occupational History  . Not on file  Tobacco Use  . Smoking status: Never Smoker  . Smokeless tobacco: Never Used  Substance and Sexual Activity  . Alcohol use: Yes    Comment: occasionally  . Drug use: No  . Sexual activity: Yes    Birth control/protection: Surgical

## 2019-04-24 LAB — CBC WITH DIFFERENTIAL/PLATELET
Absolute Monocytes: 1271 cells/uL — ABNORMAL HIGH (ref 200–950)
Basophils Absolute: 109 cells/uL (ref 0–200)
Basophils Relative: 0.7 %
Eosinophils Absolute: 233 cells/uL (ref 15–500)
Eosinophils Relative: 1.5 %
HCT: 39.6 % (ref 35.0–45.0)
Hemoglobin: 14.3 g/dL (ref 11.7–15.5)
Lymphs Abs: 4247 cells/uL — ABNORMAL HIGH (ref 850–3900)
MCH: 34.4 pg — ABNORMAL HIGH (ref 27.0–33.0)
MCHC: 36.1 g/dL — ABNORMAL HIGH (ref 32.0–36.0)
MCV: 95.2 fL (ref 80.0–100.0)
MPV: 9.8 fL (ref 7.5–12.5)
Monocytes Relative: 8.2 %
Neutro Abs: 9641 cells/uL — ABNORMAL HIGH (ref 1500–7800)
Neutrophils Relative %: 62.2 %
Platelets: 657 10*3/uL — ABNORMAL HIGH (ref 140–400)
RBC: 4.16 10*6/uL (ref 3.80–5.10)
RDW: 12.4 % (ref 11.0–15.0)
Total Lymphocyte: 27.4 %
WBC: 15.5 10*3/uL — ABNORMAL HIGH (ref 3.8–10.8)

## 2019-04-24 LAB — LIPID PANEL
Cholesterol: 211 mg/dL — ABNORMAL HIGH (ref <200)
HDL: 46 mg/dL — ABNORMAL LOW (ref >=50)
LDL Cholesterol (Calc): 129 mg/dL (calc) — ABNORMAL HIGH
Non-HDL Cholesterol (Calc): 165 mg/dL (calc) — ABNORMAL HIGH (ref <130)
Total CHOL/HDL Ratio: 4.6 (calc) (ref <5.0)
Triglycerides: 218 mg/dL — ABNORMAL HIGH (ref <150)

## 2019-04-24 LAB — COMPREHENSIVE METABOLIC PANEL
AG Ratio: 1.7 (calc) (ref 1.0–2.5)
ALT: 19 U/L (ref 6–29)
AST: 17 U/L (ref 10–30)
Albumin: 4.3 g/dL (ref 3.6–5.1)
Alkaline phosphatase (APISO): 43 U/L (ref 31–125)
BUN: 16 mg/dL (ref 7–25)
CO2: 23 mmol/L (ref 20–32)
Calcium: 9.6 mg/dL (ref 8.6–10.2)
Chloride: 105 mmol/L (ref 98–110)
Creat: 0.78 mg/dL (ref 0.50–1.10)
Globulin: 2.5 g/dL (calc) (ref 1.9–3.7)
Glucose, Bld: 80 mg/dL (ref 65–99)
Potassium: 4 mmol/L (ref 3.5–5.3)
Sodium: 139 mmol/L (ref 135–146)
Total Bilirubin: 2 mg/dL — ABNORMAL HIGH (ref 0.2–1.2)
Total Protein: 6.8 g/dL (ref 6.1–8.1)

## 2019-04-24 LAB — THYROID PANEL WITH TSH
Free Thyroxine Index: 2.4 (ref 1.4–3.8)
T3 Uptake: 28 % (ref 22–35)
T4, Total: 8.7 ug/dL (ref 5.1–11.9)
TSH: 0.71 mIU/L

## 2019-04-24 LAB — THYROID PEROXIDASE ANTIBODY: Thyroperoxidase Ab SerPl-aCnc: <1 IU/mL (ref <9)

## 2019-04-24 LAB — HIGH SENSITIVITY CRP: hs-CRP: 2.2 mg/L

## 2019-04-25 ENCOUNTER — Telehealth: Payer: Self-pay | Admitting: Family Medicine

## 2019-04-25 NOTE — Telephone Encounter (Signed)
Labs look good overall.  Triglycerides are definitely improving.  With continued dietary changes, other lipid numbers will improve as well.

## 2019-05-31 ENCOUNTER — Encounter: Payer: Self-pay | Admitting: Family Medicine

## 2019-06-02 ENCOUNTER — Encounter: Payer: Self-pay | Admitting: Family Medicine

## 2019-06-16 ENCOUNTER — Encounter: Payer: Self-pay | Admitting: Family Medicine

## 2019-06-16 ENCOUNTER — Other Ambulatory Visit: Payer: Self-pay

## 2019-06-16 ENCOUNTER — Other Ambulatory Visit: Payer: Self-pay | Admitting: Physician Assistant

## 2019-06-16 ENCOUNTER — Telehealth: Payer: Self-pay | Admitting: Family Medicine

## 2019-06-16 MED ORDER — PROMETHAZINE HCL 12.5 MG PO TABS: 12.5000 mg | ORAL_TABLET | Freq: Four times a day (QID) | ORAL | 0 refills | Status: DC | PRN

## 2019-06-16 MED ORDER — ONDANSETRON 4 MG PO TBDP: 4.0000 mg | ORAL_TABLET | Freq: Three times a day (TID) | ORAL | 0 refills | Status: DC | PRN

## 2019-06-16 NOTE — Telephone Encounter (Signed)
Can you send in 4mg  odt zofran to take tid prn nausea

## 2019-06-16 NOTE — Telephone Encounter (Signed)
Patient called stating that she woke up with a stomach virus and wanted to know if Dr. Prince Rome would call her in an RX for Phenergan.   Patient uses [facility].  CB#[phone].  Thank you.

## 2019-06-16 NOTE — Telephone Encounter (Signed)
Pt called stating the zofran prescribed to her isn't working and she needs the sinigan medication instead. Pt would like a call when this has been called in.   [phone]

## 2019-06-16 NOTE — Telephone Encounter (Signed)
Phenergan called in

## 2019-06-16 NOTE — Telephone Encounter (Signed)
Patient states that the zofran she was given isn't working, asking for phenergan

## 2019-06-16 NOTE — Telephone Encounter (Signed)
Already sent another note to Dr. Roda Shutters to advise

## 2019-06-19 MED ORDER — PROMETHAZINE HCL 25 MG PO TABS: 25.0000 mg | ORAL_TABLET | Freq: Four times a day (QID) | ORAL | 3 refills | Status: DC | PRN

## 2019-06-19 NOTE — Telephone Encounter (Signed)
Will you forward to hilts to make sure he is aware and ok to send in other?

## 2019-07-03 ENCOUNTER — Encounter: Payer: Self-pay | Admitting: Family Medicine

## 2019-07-09 ENCOUNTER — Encounter: Payer: Self-pay | Admitting: Family Medicine

## 2019-07-10 ENCOUNTER — Encounter: Payer: Self-pay | Admitting: Family Medicine

## 2019-07-10 ENCOUNTER — Ambulatory Visit (INDEPENDENT_AMBULATORY_CARE_PROVIDER_SITE_OTHER): Payer: 59 | Admitting: Family Medicine

## 2019-07-10 ENCOUNTER — Other Ambulatory Visit: Payer: Self-pay

## 2019-07-10 ENCOUNTER — Ambulatory Visit: Payer: Self-pay

## 2019-07-10 ENCOUNTER — Ambulatory Visit
Admission: RE | Admit: 2019-07-10 | Discharge: 2019-07-10 | Disposition: A | Discharge status: Home / Self Care | Payer: 59 | Source: Ambulatory Visit | Attending: Family Medicine | Admitting: Family Medicine

## 2019-07-10 DIAGNOSIS — M25561 Pain in right knee: Secondary | ICD-10-CM

## 2019-07-10 DIAGNOSIS — M25562 Pain in left knee: Secondary | ICD-10-CM

## 2019-07-10 MED ORDER — HYDROCODONE-ACETAMINOPHEN 5-325 MG PO TABS: 1.0000 | ORAL_TABLET | Freq: Four times a day (QID) | ORAL | 0 refills | Status: DC | PRN

## 2019-07-10 MED ORDER — ONDANSETRON 4 MG PO TBDP: 4.0000 mg | ORAL_TABLET | Freq: Three times a day (TID) | ORAL | 3 refills | Status: DC | PRN

## 2019-07-10 NOTE — Progress Notes (Signed)
Left knee pain-injured on friday A little pain when walking Felt a pop when injured it Cant bend of straighten leg

## 2019-07-10 NOTE — Progress Notes (Signed)
Office Visit Note   Patient: Bonnie Gibson           Date of Birth: [birth year]           MRN: [id number] Visit Date: 07/10/2019 Requested by: Clovis Riley, L.August Saucer, MD [address] [place],  [place] [address] PCP: Clovis Riley, L.August Saucer, MD  Subjective: Chief Complaint  Patient presents with  . Left Knee - Pain    HPI: She is here with left knee pain.  On Friday playing softball she slid into a base.  Immediate severe pain and popping on the medial side of her left knee.  Able to get up and continue playing but walking with a limp.  That night she could not sleep and has been unable to sleep since then.  The knee is bruised, very swollen and extremely tender on the medial and anterior aspect.  She also has chronic troubles on the lateral aspect of her right knee.               ROS:   All other systems were reviewed and are negative.  Objective: Vital Signs: LMP 03/25/2016 (Exact Date)   Physical Exam:  General:  Alert and oriented, in no acute distress. Pulm:  Breathing unlabored. Psy:  Normal mood, congruent affect. Skin: There is bruising on the medial side of her left knee and an abrasion over the patella. Left knee: 1+ effusion, extremely tender to palpation on the medial side of the joint and over the patella tendon.  She is able to actively extend the knee but cannot flex it beyond about 20 degrees before pain limits further motion.  Unable to adequately do ligamentous testing due to the severity of her pain. Right knee: No effusion, trace patellofemoral click.  Tender on the lateral joint line with no palpable click on McMurray's.   Imaging: XR Knee 1-2 Views Left  Result Date: 07/10/2019 Left knee x-rays: No obvious fracture seen.  No sign of DJD.  XR Knee 1-2 Views Right  Result Date: 07/10/2019 Right knee x-rays reveal no significant arthritic change, no sign of loose body.   Assessment & Plan: 1.  Acute left knee pain concerning for occult fracture, MCL  tear, ACL tear. -We will proceed with MRI scan. -Knee immobilizer for support.  2.  Chronic right knee pain, concerning for lateral meniscus tear. -Reassurance, probably not symptomatic enough to warrant surgery.  MRI scan if symptoms worsen.     Procedures: No procedures performed  No notes on file     PMFS History: Patient Active Problem List   Diagnosis Date Noted  . Hyperlipidemia 04/21/2019  . History of splenectomy 10/11/2017  . Fatty liver disease, nonalcoholic 10/08/2017  . Hereditary spherocytosis (HCC) 10/08/2017  . Hypertensive disorder 10/08/2017  . Anxiety disorder 10/08/2017  . Obesity (BMI 30.0-34.9) 10/08/2017  . Pelvic pain in female 04/02/2016  . 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 Anxiety   . Arthritis   . Complication of anesthesia    hard to wake up post-op  . Elevated white blood cell count    due to spherocytosis; states is usually 12,000  . Fatty liver   . Headache   . Hereditary spherocytosis (HCC)   . History of hiatal hernia   . Hypertension    has not been taking med.; advised to resume medication as directed today (08/30/2014)  . Impingement syndrome of left shoulder 08/2014  . PONV (postoperative nausea and  vomiting)   . Spherocytosis, hereditary ([place])     Family History  Problem Relation Age of Onset  . Heart defect Mother        MVP  . Aneurysm Mother        Brain  . Hypertension Mother   . Diabetes Mother   . Heart disease Father        CABG x 3  . Hypertension Father   . Cirrhosis Maternal Aunt   . Diabetes Maternal Grandmother   . Heart disease Paternal Grandfather        Needed pacemaker  . Thyroid disease Neg Hx     Past Surgical History:  Procedure Laterality Date  . BILATERAL SALPINGECTOMY N/A 04/02/2016   Procedure: BILATERAL SALPINGECTOMY;  Surgeon: Olga Millers, MD;  Location: [facility];  Service: Gynecology;  Laterality: N/A;  . CHOLECYSTECTOMY N/A 03/19/2015   Procedure:  LAPAROSCOPIC CHOLECYSTECTOMY WITH INTRAOPERATIVE CHOLANGIOGRAM;  Surgeon: Alphonsa Overall, MD;  Location: [facility];  Service: General;  Laterality: N/A;  . COLONOSCOPY    . FINGER SURGERY Left    thumb  . LAPAROSCOPIC ASSISTED VAGINAL HYSTERECTOMY N/A 04/02/2016   Procedure: LAPAROSCOPIC ASSISTED VAGINAL HYSTERECTOMY 120.4g ;  Surgeon: Olga Millers, MD;  Location: [facility];  Service: Gynecology;  Laterality: N/A;  . MYRINGOTOMY    . SHOULDER ARTHROSCOPY Right   . SHOULDER ARTHROSCOPY WITH SUBACROMIAL DECOMPRESSION Left 09/05/2014   Procedure: SHOULDER ARTHROSCOPY WITH SUBACROMIAL DECOMPRESSION;  Surgeon: Leandrew Koyanagi, MD;  Location: [facility];  Service: Orthopedics;  Laterality: Left;  . SPLENECTOMY, TOTAL    . TONSILLECTOMY    . TUBAL LIGATION  04/20/2002  . UPPER GI ENDOSCOPY    . WISDOM TOOTH EXTRACTION     Social History   Occupational History  . Not on file  Tobacco Use  . Smoking status: Never Smoker  . Smokeless tobacco: Never Used  Substance and Sexual Activity  . Alcohol use: Yes    Comment: occasionally  . Drug use: No  . Sexual activity: Yes    Birth control/protection: Surgical

## 2019-07-11 ENCOUNTER — Encounter: Payer: Self-pay | Admitting: Family Medicine

## 2019-07-11 ENCOUNTER — Telehealth: Payer: Self-pay | Admitting: Family Medicine

## 2019-07-11 NOTE — Telephone Encounter (Signed)
MRI scan shows a sprain of the medial collateral ligament/MCL.  There is also a small tear of the medial meniscus cartilage.  Cruciate ligaments are intact, no sign of fracture.  No indication for surgery at this point.

## 2019-07-12 ENCOUNTER — Encounter: Payer: Self-pay | Admitting: Family Medicine

## 2019-07-14 ENCOUNTER — Other Ambulatory Visit: Payer: Self-pay

## 2019-07-14 DIAGNOSIS — M25562 Pain in left knee: Secondary | ICD-10-CM

## 2019-08-01 ENCOUNTER — Encounter: Payer: Self-pay | Admitting: Family Medicine

## 2019-09-12 ENCOUNTER — Encounter: Payer: Self-pay | Admitting: Family Medicine

## 2019-09-13 ENCOUNTER — Encounter: Payer: Self-pay | Admitting: Family Medicine

## 2019-10-13 ENCOUNTER — Encounter: Payer: Self-pay | Admitting: Family Medicine

## 2019-10-13 MED ORDER — ONDANSETRON 4 MG PO TBDP: 4.0000 mg | ORAL_TABLET | Freq: Three times a day (TID) | ORAL | 3 refills | Status: DC | PRN

## 2019-10-13 MED ORDER — ALPRAZOLAM 0.5 MG PO TABS: ORAL_TABLET | ORAL | 0 refills | Status: DC

## 2019-11-01 ENCOUNTER — Encounter: Payer: Self-pay | Admitting: Family Medicine

## 2019-11-01 ENCOUNTER — Other Ambulatory Visit: Payer: Self-pay | Admitting: Family Medicine

## 2019-11-01 ENCOUNTER — Ambulatory Visit: Payer: 59 | Admitting: Physical Therapy

## 2019-11-01 DIAGNOSIS — R42 Dizziness and giddiness: Secondary | ICD-10-CM

## 2019-11-06 ENCOUNTER — Encounter: Payer: Self-pay | Admitting: Family Medicine

## 2019-11-15 ENCOUNTER — Encounter: Payer: Self-pay | Admitting: Family Medicine

## 2019-11-16 ENCOUNTER — Encounter: Payer: Self-pay | Admitting: Family Medicine

## 2019-11-17 ENCOUNTER — Encounter: Payer: Self-pay | Admitting: Family Medicine

## 2019-11-22 ENCOUNTER — Other Ambulatory Visit: Payer: Self-pay

## 2019-11-22 ENCOUNTER — Ambulatory Visit (INDEPENDENT_AMBULATORY_CARE_PROVIDER_SITE_OTHER): Payer: 59 | Admitting: Family Medicine

## 2019-11-22 ENCOUNTER — Encounter: Payer: Self-pay | Admitting: Family Medicine

## 2019-11-22 DIAGNOSIS — G8929 Other chronic pain: Secondary | ICD-10-CM | POA: Diagnosis not present

## 2019-11-22 DIAGNOSIS — M25562 Pain in left knee: Secondary | ICD-10-CM

## 2019-11-22 DIAGNOSIS — F419 Anxiety disorder, unspecified: Secondary | ICD-10-CM | POA: Diagnosis not present

## 2019-11-22 MED ORDER — ESCITALOPRAM OXALATE 5 MG PO TABS
5.0000 mg | ORAL_TABLET | Freq: Every day | ORAL | 6 refills | Status: DC
Start: 2019-11-22 — End: 2020-04-09

## 2019-11-22 NOTE — Progress Notes (Signed)
Office Visit Note   Patient: Bonnie Gibson           Date of Birth: Jul 15, 1975           MRN: [id number] Visit Date: 11/22/2019 Requested by: Clovis Riley, L.August Saucer, MD [address] [place],  [place] [address] PCP: Clovis Riley, L.August Saucer, MD  Subjective: Chief Complaint  Patient presents with  . Left Knee - Pain, Follow-up    Cortisone injection    HPI: She is here with persistent left knee pain.  MRI scan in May showed a medial meniscus tear.  She is going to her son's wedding this weekend and would like to try an injection.  Her right knee is starting to bother her from favoring the left.  She also reports ongoing issues with anxiety and possible ADHD.  Took stimulant meds as a child.  Having trouble focusing due to anxiety.  Uses xanax sparingly - makes her too drowsy to function.  Also thinks she might be a little depressed.                ROS:   All other systems were reviewed and are negative.  Objective: Vital Signs: LMP 03/25/2016 (Exact Date)   Physical Exam:  General:  Alert and oriented, in no acute distress. Pulm:  Breathing unlabored. Psy:  Normal mood, congruent affect.  Left knee:  No effusion.  Tender medially.   Imaging: No results found.  Assessment & Plan: 1.  Left knee MMT - Injection today. - Surgical consult if symptoms persist.  2.  Anxiety - Try L-Theanine. - Consider SSRI. - Consider testing for ADHD.     Procedures: Left knee injection: After sterile prep with Betadine, injected 4 cc 1% lidocaine without epinephrine and 40 mg methylprednisolone from lateral midpatellar approach.    PMFS History: Patient Active Problem List   Diagnosis Date Noted  . Hyperlipidemia 04/21/2019  . History of splenectomy 10/11/2017  . Fatty liver disease, nonalcoholic 10/08/2017  . Hereditary spherocytosis (HCC) 10/08/2017  . Hypertensive disorder 10/08/2017  . Anxiety disorder 10/08/2017  . Obesity (BMI 30.0-34.9) 10/08/2017  . Pelvic pain in  female 04/02/2016  . 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 Anxiety   . Arthritis   . Complication of anesthesia    hard to wake up post-op  . Elevated white blood cell count    due to spherocytosis; states is usually 12,000  . Fatty liver   . Headache   . Hereditary spherocytosis (HCC)   . History of hiatal hernia   . Hypertension    has not been taking med.; advised to resume medication as directed today (08/30/2014)  . Impingement syndrome of left shoulder 08/2014  . PONV (postoperative nausea and vomiting)   . Spherocytosis, hereditary (HCC)     Family History  Problem Relation Age of Onset  . Heart defect Mother        MVP  . Aneurysm Mother        Brain  . Hypertension Mother   . Diabetes Mother   . Heart disease Father        CABG x 3  . Hypertension Father   . Cirrhosis Maternal Aunt   . Diabetes Maternal Grandmother   . Heart disease Paternal Grandfather        Needed pacemaker  . Thyroid disease Neg Hx     Past Surgical History:  Procedure Laterality Date  . BILATERAL SALPINGECTOMY N/A  04/02/2016   Procedure: BILATERAL SALPINGECTOMY;  Surgeon: Levi Aland, MD;  Location: [facility];  Service: Gynecology;  Laterality: N/A;  . CHOLECYSTECTOMY N/A 03/19/2015   Procedure: LAPAROSCOPIC CHOLECYSTECTOMY WITH INTRAOPERATIVE CHOLANGIOGRAM;  Surgeon: Ovidio Kin, MD;  Location: [facility];  Service: General;  Laterality: N/A;  . COLONOSCOPY    . FINGER SURGERY Left    thumb  . LAPAROSCOPIC ASSISTED VAGINAL HYSTERECTOMY N/A 04/02/2016   Procedure: LAPAROSCOPIC ASSISTED VAGINAL HYSTERECTOMY 120.4g ;  Surgeon: Levi Aland, MD;  Location: [facility];  Service: Gynecology;  Laterality: N/A;  . MYRINGOTOMY    . SHOULDER ARTHROSCOPY Right   . SHOULDER ARTHROSCOPY WITH SUBACROMIAL DECOMPRESSION Left 09/05/2014   Procedure: SHOULDER ARTHROSCOPY WITH SUBACROMIAL DECOMPRESSION;  Surgeon: Tarry Kos, MD;  Location: [facility];   Service: Orthopedics;  Laterality: Left;  . SPLENECTOMY, TOTAL    . TONSILLECTOMY    . TUBAL LIGATION  04/20/2002  . UPPER GI ENDOSCOPY    . WISDOM TOOTH EXTRACTION     Social History   Occupational History  . Not on file  Tobacco Use  . Smoking status: Never Smoker  . Smokeless tobacco: Never Used  Substance and Sexual Activity  . Alcohol use: Yes    Comment: occasionally  . Drug use: No  . Sexual activity: Yes    Birth control/protection: Surgical

## 2019-11-22 NOTE — Addendum Note (Signed)
Addended by: Lillia Carmel on: 11/22/2019 02:05 PM   Modules accepted: Orders

## 2019-11-27 ENCOUNTER — Encounter: Payer: Self-pay | Admitting: Family Medicine

## 2019-11-27 MED ORDER — PHENTERMINE HCL 30 MG PO CAPS: ORAL_CAPSULE | ORAL | 3 refills | Status: DC

## 2019-12-15 ENCOUNTER — Encounter: Payer: Self-pay | Admitting: Family Medicine

## 2019-12-15 MED ORDER — SULFAMETHOXAZOLE-TRIMETHOPRIM 800-160 MG PO TABS: 1.0000 | ORAL_TABLET | Freq: Two times a day (BID) | ORAL | 0 refills | Status: DC

## 2019-12-22 ENCOUNTER — Encounter: Payer: Self-pay | Admitting: Family Medicine

## 2019-12-25 ENCOUNTER — Encounter: Payer: Self-pay | Admitting: Family Medicine

## 2020-01-01 ENCOUNTER — Encounter: Payer: Self-pay | Admitting: Family Medicine

## 2020-01-01 MED ORDER — FLUCONAZOLE 150 MG PO TABS: 150.0000 mg | ORAL_TABLET | ORAL | 0 refills | Status: DC

## 2020-01-30 ENCOUNTER — Encounter: Payer: Self-pay | Admitting: Family Medicine

## 2020-01-30 MED ORDER — METHYLPREDNISOLONE 4 MG PO TBPK: ORAL_TABLET | ORAL | 0 refills | Status: AC

## 2020-01-30 MED ORDER — AZITHROMYCIN 250 MG PO TABS: ORAL_TABLET | ORAL | 0 refills | Status: DC

## 2020-02-14 ENCOUNTER — Encounter: Payer: Self-pay | Admitting: Family Medicine

## 2020-02-15 ENCOUNTER — Other Ambulatory Visit (INDEPENDENT_AMBULATORY_CARE_PROVIDER_SITE_OTHER): Payer: Self-pay | Admitting: Family Medicine

## 2020-02-15 ENCOUNTER — Other Ambulatory Visit: Payer: Self-pay | Admitting: Family Medicine

## 2020-02-26 ENCOUNTER — Encounter: Payer: Self-pay | Admitting: Family Medicine

## 2020-03-11 ENCOUNTER — Encounter: Payer: Self-pay | Admitting: Family Medicine

## 2020-03-11 MED ORDER — DIETHYLPROPION HCL 25 MG PO TABS: 1.0000 | ORAL_TABLET | Freq: Three times a day (TID) | ORAL | 1 refills | Status: AC

## 2020-03-18 ENCOUNTER — Encounter: Payer: Self-pay | Admitting: Family Medicine

## 2020-03-18 MED ORDER — PREDNISONE 10 MG PO TABS: ORAL_TABLET | ORAL | 0 refills | Status: DC

## 2020-03-18 MED ORDER — AZITHROMYCIN 250 MG PO TABS: ORAL_TABLET | ORAL | 0 refills | Status: DC

## 2020-04-09 ENCOUNTER — Encounter: Payer: Self-pay | Admitting: Family Medicine

## 2020-04-09 MED ORDER — FLUCONAZOLE 150 MG PO TABS: 150.0000 mg | ORAL_TABLET | ORAL | 0 refills | Status: DC | PRN

## 2020-04-09 MED ORDER — LISINOPRIL 10 MG PO TABS: ORAL_TABLET | ORAL | 6 refills | Status: AC

## 2020-04-09 MED ORDER — ESCITALOPRAM OXALATE 5 MG PO TABS: 5.0000 mg | ORAL_TABLET | Freq: Every day | ORAL | 6 refills | Status: AC

## 2020-04-23 ENCOUNTER — Encounter: Payer: Self-pay | Admitting: Family Medicine

## 2020-05-01 ENCOUNTER — Ambulatory Visit: Payer: 59 | Admitting: Family Medicine

## 2020-05-03 ENCOUNTER — Ambulatory Visit: Payer: 59 | Admitting: Family Medicine

## 2020-05-08 ENCOUNTER — Encounter: Payer: Self-pay | Admitting: Family Medicine

## 2020-05-08 MED ORDER — ALPRAZOLAM 0.5 MG PO TABS: ORAL_TABLET | ORAL | 0 refills | Status: AC

## 2020-05-13 ENCOUNTER — Encounter: Payer: Self-pay | Admitting: Family Medicine

## 2020-05-13 ENCOUNTER — Other Ambulatory Visit: Payer: Self-pay

## 2020-05-13 ENCOUNTER — Ambulatory Visit (INDEPENDENT_AMBULATORY_CARE_PROVIDER_SITE_OTHER): Payer: 59 | Admitting: Family Medicine

## 2020-05-13 DIAGNOSIS — M25531 Pain in right wrist: Secondary | ICD-10-CM

## 2020-05-13 DIAGNOSIS — M25512 Pain in left shoulder: Secondary | ICD-10-CM

## 2020-05-13 DIAGNOSIS — J302 Other seasonal allergic rhinitis: Secondary | ICD-10-CM

## 2020-05-13 MED ORDER — PREDNISONE 10 MG PO TABS: ORAL_TABLET | ORAL | 0 refills | Status: AC

## 2020-05-13 MED ORDER — MONTELUKAST SODIUM 10 MG PO TABS: 10.0000 mg | ORAL_TABLET | Freq: Every day | ORAL | 11 refills | Status: AC | PRN

## 2020-05-13 NOTE — Progress Notes (Signed)
I saw and examined the patient with Dr. Marga Hoots and agree with assessment and plan as outlined.    Left shoulder pain for several months, worse recently.  History of arthritis and bursitis, is s/p scope a few years ago.  Mostly anterior pain.  Exam reveals crepitus, tenderness over biceps.  Will try prednisone.  Injection if persists.  Right wrist pain and numbness in median and ulnar distribution.  Now working at [employer] in [place] as [employer].  Wearing brace during daytime.  Having allergies to all sorts of things.  Never had them in past.  Will start quercetin, singulair.  Allergy consult.

## 2020-05-13 NOTE — Progress Notes (Signed)
Office Visit Note   Patient: Bonnie Gibson           Date of Birth: 01-14-1976           MRN: [id number] Visit Date: 05/13/2020 Requested by: Clovis Riley, L.August Saucer, MD [address] [place],  [place] [address] PCP: Clovis Riley, L.August Saucer, MD  Subjective: Chief Complaint  Patient presents with  . Left Shoulder - Pain    Pain in the anterior shoulder. Decreased ROM.  . Right Wrist - Pain    Pain in the wrist, with numbness in ring and little fingers - has to wear a wrist splint, due to this. Been going on for about 2 weeks.     HPI: 45 year old female presenting to clinic with concerns of left shoulder and right wrist pain.  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  She says she has a history of arthritis and bursitis in the shoulder, requiring arthroscopic debridement approximately 5 years ago.  She had been doing well after the surgery until her recent pain started.  She states her pain is worse with reaching overhead, specifically when trying to blow dry her hair.  She no longer trust herself to carry any plates on the affected side at work.  She states her pain is primarily focused in the anterior aspect of her shoulder, and is not significantly improved with her baseline Celebrex or Aleve.  She is worried that she may have a recurrence of her aforementioned arthritis and bursitis, but states her pain feels more similar now to when she had a labral tear on her right shoulder approximately 18 years ago.  No trauma that she can think of prior to the onset of the symptoms. Per her right wrist, patient states that she has a longstanding history of carpal tunnel, for which she was previously prescribed carpal tunnel braces.  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  She says when she wears her carpal tunnel brace she still  able to function, as this significantly improves the tingling in her first through third fingers.  She wears her brace throughout the day while at work, but takes it off to sleep.  She says she tries very hard to sleep with her arms resting at her side, but "no matter what position I am and I always wake up with both of my hands completely numb." Additionally, patient is curious about a referral for allergy testing as she states that she has severe seasonal allergies, and wants to know if there are any avoidable triggers.  Her symptoms are currently managed with over-the-counter Allegra.              ROS:   All other systems were reviewed and are negative.  Objective: Vital Signs: LMP 03/25/2016 (Exact Date)   Physical Exam:  General:  Alert and oriented, in no acute distress. Pulm:  Breathing unlabored. Psy:  Normal mood, congruent affect. Skin: Left shoulder and right arm with no bruising, rashes, or erythema.  Overlying skin intact. Left shoulder Exam:  Inspection: Symmetric muscle mass, no atrophy or deformity. Palpation: Endorses significant tenderness to palpation over the insertion site of the long head of the biceps on the left.  No tenderness to palpation over and around the acromion, or over the [facility]  joint.  Range of motion: Full range of motion in forward flexion, abduction and extension, though she does endorse pain with all these movements as they progressed beyond 90 degrees horizontally.  Full External rotation to 100 degrees, though does endorse discomfort beyond 90. Apley scratch reduced to approximately the level of T11.  Rotator cuff testing:  Full strength and mild pain with empty can (supraspinatus).  External and internal rotation with full strength and no pain.  AC joint testing: No AC tenderness to palpation, negative scarf test. Endorses pain with active compression test.  Biceps testing: Pain with speeds, positive Yergason.  Labral testing: O'Brien's/speeds with  full strength however endorses significant discomfort. Biceps load II: Negative Crank test: Subtle clicking appreciated.  Impingement testing: Endorses pain with Hawkins   Right wrist Exam:  No obvious swelling, deformity or discoloration.  ROM: Full Active ROM in wrist and all fingers without pain. Normal MCP/PIP/DIP joint flexion and extension of all fingers. No finger rotational abnormality.   Strength: No pain with resisted extension or flexion of the wrist.  Normal Grip strength, Normal thumb abduction and opposition.  Normal finger abduction and adduction, and normal flexor superficialis (PIP) and Profundus strength (DIP).   Palpation: Endorses tenderness with Tinel's at the carpal tunnel, which reproduces the tingling in her first 3 fingers.  No Snuff Box tenderness. No pain with compression over the lunate and triquetrum.  No pain over distal radius or ulna.   Spurling's negative bilaterally  Carpal tunnel: Phalen's positive. Tinel's positive. Cubital Tunnel: Tinnel negative in the cubital and guyon tunnels Endorses reduced sensation throughout all fingers of right hand.   Strength testing:  5 out of 5 strength with shoulder abduction (C5), wrist extension (C6), wrist flexion (C7), grip strength (C8), and finger abduction (T1).  Sensation: Intact to light touch throughout bilateral upper extremities.   Brisk distal capillary refill.   Imaging: No results found.  Assessment & Plan: 45 year old female presenting to clinic with concerns of left shoulder and right hand pain.  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  Left shoulder pain: Significant pain with biceps strength testing as well as with palpation of the biceps insertion site.  Suspect an overuse bicipital tendinitis, though it is possible that she has labral pathology given her history of the same and similar symptom  presentation.  Right wrist pain, numbness: Symptoms are consistent with history of carpal tunnel syndrome, for now including her fourth and fifth fingers suggesting a ulnar pathology as well.  Spurling's is negative bilaterally. -Discussed sleeping in carpal tunnel brace to help with nerve healing at night.  -Given multiple inflammatory pathologies, will do a short course of prednisone to address all of her concerns at once. -We will place referral to allergist for specific immune testing to help with avoidance. -Patient was agreeable with plan.  She no further questions or concerns today.     Procedures: No procedures performed        PMFS History: Patient Active Problem List   Diagnosis Date Noted  . Hyperlipidemia 04/21/2019  . History of splenectomy 10/11/2017  . Fatty liver disease, nonalcoholic 10/08/2017  . Hereditary spherocytosis (HCC) 10/08/2017  . Hypertensive disorder 10/08/2017  . Anxiety disorder 10/08/2017  . Obesity (BMI 30.0-34.9) 10/08/2017  . Pelvic pain in female 04/02/2016  . 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 Anxiety   . Arthritis   .  Complication of anesthesia    hard to wake up post-op  . Elevated white blood cell count    due to spherocytosis; states is usually 12,000  . Fatty liver   . Headache   . Hereditary spherocytosis (HCC)   . History of hiatal hernia   . Hypertension    has not been taking med.; advised to resume medication as directed today (08/30/2014)  . Impingement syndrome of left shoulder 08/2014  . PONV (postoperative nausea and vomiting)   . Spherocytosis, hereditary ([facility])     Family History  Problem Relation Age of Onset  . Heart defect Mother        MVP  . Aneurysm Mother        Brain  . Hypertension Mother   . Diabetes Mother   . Heart disease Father        CABG x 3  . Hypertension Father   . Cirrhosis Maternal Aunt   . Diabetes Maternal Grandmother   . Heart disease  Paternal Grandfather        Needed pacemaker  . Thyroid disease Neg Hx     Past Surgical History:  Procedure Laterality Date  . BILATERAL SALPINGECTOMY N/A 04/02/2016   Procedure: BILATERAL SALPINGECTOMY;  Surgeon: Levi Aland, MD;  Location: [facility];  Service: Gynecology;  Laterality: N/A;  . CHOLECYSTECTOMY N/A 03/19/2015   Procedure: LAPAROSCOPIC CHOLECYSTECTOMY WITH INTRAOPERATIVE CHOLANGIOGRAM;  Surgeon: Ovidio Kin, MD;  Location: [facility];  Service: General;  Laterality: N/A;  . COLONOSCOPY    . FINGER SURGERY Left    thumb  . LAPAROSCOPIC ASSISTED VAGINAL HYSTERECTOMY N/A 04/02/2016   Procedure: LAPAROSCOPIC ASSISTED VAGINAL HYSTERECTOMY 120.4g ;  Surgeon: Levi Aland, MD;  Location: [facility];  Service: Gynecology;  Laterality: N/A;  . MYRINGOTOMY    . SHOULDER ARTHROSCOPY Right   . SHOULDER ARTHROSCOPY WITH SUBACROMIAL DECOMPRESSION Left 09/05/2014   Procedure: SHOULDER ARTHROSCOPY WITH SUBACROMIAL DECOMPRESSION;  Surgeon: Tarry Kos, MD;  Location: [facility];  Service: Orthopedics;  Laterality: Left;  . SPLENECTOMY, TOTAL    . TONSILLECTOMY    . TUBAL LIGATION  04/20/2002  . UPPER GI ENDOSCOPY    . WISDOM TOOTH EXTRACTION     Social History   Occupational History  . Not on file  Tobacco Use  . Smoking status: Never Smoker  . Smokeless tobacco: Never Used  Substance and Sexual Activity  . Alcohol use: Yes    Comment: occasionally  . Drug use: No  . Sexual activity: Yes    Birth control/protection: Surgical

## 2020-05-15 ENCOUNTER — Encounter: Payer: Self-pay | Admitting: Family Medicine

## 2020-05-17 ENCOUNTER — Encounter: Payer: Self-pay | Admitting: Family Medicine

## 2020-05-27 ENCOUNTER — Encounter: Payer: Self-pay | Admitting: Family Medicine

## 2020-05-28 ENCOUNTER — Encounter: Payer: Self-pay | Admitting: Family Medicine

## 2020-05-28 MED ORDER — PREDNISONE 10 MG PO TABS: ORAL_TABLET | ORAL | 0 refills | Status: AC

## 2020-06-03 ENCOUNTER — Encounter: Payer: Self-pay | Admitting: Family Medicine

## 2020-06-03 MED ORDER — ONDANSETRON 4 MG PO TBDP
4.0000 mg | ORAL_TABLET | Freq: Three times a day (TID) | ORAL | 3 refills | Status: AC | PRN
Start: 2020-06-03 — End: ?

## 2020-06-06 ENCOUNTER — Encounter: Payer: Self-pay | Admitting: Family Medicine

## 2020-06-24 ENCOUNTER — Encounter: Payer: Self-pay | Admitting: Family Medicine

## 2020-06-25 ENCOUNTER — Encounter: Payer: Self-pay | Admitting: Family Medicine

## 2020-06-25 DIAGNOSIS — M25512 Pain in left shoulder: Secondary | ICD-10-CM

## 2020-06-25 MED ORDER — DICLOFENAC SODIUM 75 MG PO TBEC: 75.0000 mg | DELAYED_RELEASE_TABLET | Freq: Two times a day (BID) | ORAL | 3 refills | Status: AC | PRN

## 2020-06-26 NOTE — Addendum Note (Signed)
Addended by: Lillia Carmel on: 06/26/2020 10:00 AM   Modules accepted: Orders

## 2020-07-05 ENCOUNTER — Ambulatory Visit: Payer: 59 | Admitting: Allergy

## 2020-07-08 ENCOUNTER — Encounter: Payer: Self-pay | Admitting: Family Medicine

## 2020-07-09 ENCOUNTER — Ambulatory Visit (INDEPENDENT_AMBULATORY_CARE_PROVIDER_SITE_OTHER): Payer: 59

## 2020-07-09 ENCOUNTER — Other Ambulatory Visit: Payer: Self-pay

## 2020-07-09 ENCOUNTER — Ambulatory Visit
Admission: RE | Admit: 2020-07-09 | Discharge: 2020-07-09 | Disposition: A | Discharge status: Home / Self Care | Payer: 59 | Source: Ambulatory Visit | Attending: Family Medicine | Admitting: Family Medicine

## 2020-07-09 DIAGNOSIS — M25562 Pain in left knee: Secondary | ICD-10-CM

## 2020-07-09 DIAGNOSIS — M25512 Pain in left shoulder: Secondary | ICD-10-CM

## 2020-07-09 DIAGNOSIS — M25561 Pain in right knee: Secondary | ICD-10-CM

## 2020-07-09 DIAGNOSIS — M25531 Pain in right wrist: Secondary | ICD-10-CM

## 2020-07-09 NOTE — Progress Notes (Signed)
Nurse visit only: fitted for 2 hinged knee braces (lg) and new right wrist splint given, per Dr. Prince Rome.

## 2020-07-10 ENCOUNTER — Encounter: Payer: Self-pay | Admitting: Family Medicine

## 2020-07-11 ENCOUNTER — Telehealth: Payer: Self-pay | Admitting: Family Medicine

## 2020-07-11 DIAGNOSIS — M25512 Pain in left shoulder: Secondary | ICD-10-CM

## 2020-07-11 NOTE — Telephone Encounter (Signed)
Error

## 2020-07-11 NOTE — Telephone Encounter (Signed)
MRI shows a substantial amount of irritation in the rotator cuff with some partial tears.    There's arthritis at the end of the collarbone [facility] joint), as well as early arthritis in the glenohumeral (ball & socket) joint.    Long-term strengthening will be important, to decrease pain and increase range of motion.  Would suggest working with Bonnie Gibson in PT for a month or two.

## 2020-07-29 ENCOUNTER — Encounter: Payer: Self-pay | Admitting: Family Medicine

## 2020-07-30 ENCOUNTER — Encounter: Payer: Self-pay | Admitting: Family Medicine

## 2020-07-30 DIAGNOSIS — G8929 Other chronic pain: Secondary | ICD-10-CM

## 2020-07-30 NOTE — Addendum Note (Signed)
Addended by: Lillia Carmel on: 07/30/2020 01:49 PM   Modules accepted: Orders

## 2020-07-30 NOTE — Telephone Encounter (Signed)
Appt made

## 2020-08-05 ENCOUNTER — Encounter: Payer: Self-pay | Admitting: Family Medicine

## 2020-08-06 ENCOUNTER — Ambulatory Visit: Payer: 59 | Admitting: Orthopaedic Surgery

## 2020-08-07 ENCOUNTER — Telehealth: Payer: Self-pay

## 2020-08-07 ENCOUNTER — Encounter: Payer: Self-pay | Admitting: Orthopaedic Surgery

## 2020-08-07 ENCOUNTER — Ambulatory Visit (INDEPENDENT_AMBULATORY_CARE_PROVIDER_SITE_OTHER): Payer: 59 | Admitting: Orthopaedic Surgery

## 2020-08-07 DIAGNOSIS — M25512 Pain in left shoulder: Secondary | ICD-10-CM | POA: Diagnosis not present

## 2020-08-07 DIAGNOSIS — M1712 Unilateral primary osteoarthritis, left knee: Secondary | ICD-10-CM | POA: Diagnosis not present

## 2020-08-07 DIAGNOSIS — G5621 Lesion of ulnar nerve, right upper limb: Secondary | ICD-10-CM | POA: Diagnosis not present

## 2020-08-07 DIAGNOSIS — M1711 Unilateral primary osteoarthritis, right knee: Secondary | ICD-10-CM | POA: Diagnosis not present

## 2020-08-07 DIAGNOSIS — G8929 Other chronic pain: Secondary | ICD-10-CM

## 2020-08-07 NOTE — Telephone Encounter (Signed)
Please submit for bil knee synvisc gel inj's

## 2020-08-07 NOTE — Telephone Encounter (Signed)
Pt scheduled and aware of appt

## 2020-08-07 NOTE — Progress Notes (Signed)
Office Visit Note   Patient: Bonnie Gibson           Date of Birth: [birth year]           MRN: [id number] Visit Date: 08/07/2020              Requested by: Clovis Riley, L.August Saucer, MD [address] [place],  [place] [address] PCP: Clovis Riley, L.August Saucer, MD   Assessment & Plan: Visit Diagnoses:  1. Primary osteoarthritis of right knee   2. Primary osteoarthritis of left knee   3. Chronic left shoulder pain   4. Cubital tunnel syndrome on right     Plan: For the left shoulder I reviewed the recent MRI which shows rotator cuff tendinosis without any full-thickness tears.  She is interested in intra-articular injection and she will also do the physical therapy as prescribed.  At this point I do not recommend surgery based on the findings.  For the right hand we will obtain nerve conduction studies to evaluate for suspected cubital tunnel syndrome.  For the knees will wait for the right knee MRI to be completed so that we can thoroughly discussed her options.  She is interested in Visco injections to both knees.  Follow-Up Instructions: No follow-ups on file.   Orders:  No orders of the defined types were placed in this encounter.  No orders of the defined types were placed in this encounter.     Procedures: No procedures performed   Clinical Data: No additional findings.   Subjective: Chief Complaint  Patient presents with   Left Shoulder - Pain   Right Hand - Pain   Right Knee - Pain   Left Knee - Pain    [name] comes in today for evaluation of right hand numbness, left shoulder pain, bilateral knee pain.  For the right hand which is the most symptomatic she states that she has constant numbness of the ring and small finger that is worse with constant use of the hand and elbow flexion.  She often wakes up with the entire hand being numb.  She works as a Child psychotherapist.  In terms of the left shoulder she will start physical therapy soon she had a debridement back in 2016 that I  did.  She denies any injuries.  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  Both of her knees chronically hurt.  She had an MRI last year which shows some mild chondromalacia without any real meniscal pathology.  Right knee MRI is pending.  She has had a cortisone injection of the left knee before with marginal relief.   Review of Systems  Constitutional: Negative.   HENT: Negative.    Eyes: Negative.   Respiratory: Negative.    Cardiovascular: Negative.   Endocrine: Negative.   Musculoskeletal: Negative.   Neurological: Negative.   Hematological: Negative.   Psychiatric/Behavioral: Negative.    All other systems reviewed and are negative.   Objective: Vital Signs: LMP 03/25/2016 (Exact Date)   Physical Exam Vitals and nursing note reviewed.  Constitutional:      Appearance: She is well-developed.  Pulmonary:     Effort: Pulmonary effort is normal.  Skin:    General: Skin is warm.     Capillary Refill: Capillary refill takes less than 2 seconds.  Neurological:     Mental Status: She is alert and oriented to person, place, and time.  Psychiatric:        Behavior: Behavior normal.        Thought  Content: Thought content normal.        Judgment: Judgment normal.    Ortho Exam Right upper extremity exam shows a decrease sensation ulnar nerve distribution to the small finger and ring finger.  Negative carpal tunnel compressive signs.  Ulnar nerve is stable with elbow flexion.  Negative Tinel's at the cubital tunnel.  No muscle atrophy.  Left shoulder shows normal range of motion.  Mild to moderate discomfort with manual muscle testing.  Strength is 5 out of 5.  Bilateral knees show no joint effusion.  Normal range of motion.  Mild patellofemoral crepitus.  Collaterals and cruciates are stable. Specialty Comments:  No specialty comments available.  Imaging: No results found.   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/2015   Past Medical History:  Diagnosis Date   Anxiety    Arthritis    Complication of anesthesia    hard to wake up post-op   Elevated white blood cell count    due to spherocytosis; states is usually 12,000   Fatty liver    Headache    Hereditary spherocytosis (HCC)    History of hiatal hernia    Hypertension    has not been taking med.; advised to resume medication as directed today (08/30/2014)   Impingement syndrome of left shoulder 08/2014   PONV (postoperative nausea and vomiting)    Spherocytosis, hereditary (HCC)     Family History  Problem Relation Age of Onset   Heart defect Mother        MVP   Aneurysm Mother        Brain   Hypertension Mother    Diabetes Mother    Heart disease Father        CABG x 3   Hypertension Father    Cirrhosis Maternal Aunt    Diabetes Maternal Grandmother    Heart disease Paternal Grandfather        Needed pacemaker   Thyroid disease Neg Hx     Past Surgical History:  Procedure Laterality Date   BILATERAL SALPINGECTOMY N/A 04/02/2016   Procedure: BILATERAL SALPINGECTOMY;  Surgeon: Levi Aland, MD;  Location: [facility];  Service: Gynecology;  Laterality: N/A;   CHOLECYSTECTOMY N/A 03/19/2015   Procedure: LAPAROSCOPIC CHOLECYSTECTOMY WITH INTRAOPERATIVE CHOLANGIOGRAM;  Surgeon: Ovidio Kin, MD;  Location: [facility];  Service: General;  Laterality: N/A;   COLONOSCOPY     FINGER SURGERY Left    thumb   LAPAROSCOPIC ASSISTED VAGINAL HYSTERECTOMY N/A 04/02/2016   Procedure: LAPAROSCOPIC ASSISTED VAGINAL HYSTERECTOMY 120.4g ;  Surgeon: Levi Aland, MD;  Location: [facility];  Service: Gynecology;  Laterality: N/A;   MYRINGOTOMY     SHOULDER ARTHROSCOPY Right     SHOULDER ARTHROSCOPY WITH SUBACROMIAL DECOMPRESSION Left 09/05/2014   Procedure: SHOULDER ARTHROSCOPY WITH SUBACROMIAL DECOMPRESSION;  Surgeon: Tarry Kos, MD;  Location: [facility];  Service: Orthopedics;  Laterality: Left;   SPLENECTOMY, TOTAL     TONSILLECTOMY     TUBAL LIGATION  04/20/2002   UPPER GI ENDOSCOPY     WISDOM TOOTH EXTRACTION     Social History   Occupational History   Not on file  Tobacco  Use   Smoking status: Never   Smokeless tobacco: Never  Substance and Sexual Activity   Alcohol use: Yes    Comment: occasionally   Drug use: No   Sexual activity: Yes    Birth control/protection: Surgical

## 2020-08-07 NOTE — Addendum Note (Signed)
Addended by: Albertina Parr on: 08/07/2020 11:15 AM   Modules accepted: Orders

## 2020-08-07 NOTE — Telephone Encounter (Signed)
Noted  

## 2020-08-09 ENCOUNTER — Encounter: Payer: Self-pay | Admitting: Family Medicine

## 2020-08-10 ENCOUNTER — Other Ambulatory Visit: Payer: 59

## 2020-08-15 ENCOUNTER — Ambulatory Visit (INDEPENDENT_AMBULATORY_CARE_PROVIDER_SITE_OTHER): Payer: 59 | Admitting: Family Medicine

## 2020-08-15 ENCOUNTER — Ambulatory Visit: Payer: Self-pay

## 2020-08-15 ENCOUNTER — Other Ambulatory Visit: Payer: Self-pay

## 2020-08-15 DIAGNOSIS — M25512 Pain in left shoulder: Secondary | ICD-10-CM

## 2020-08-15 DIAGNOSIS — G8929 Other chronic pain: Secondary | ICD-10-CM

## 2020-08-15 NOTE — Progress Notes (Signed)
Subjective: Patient is here for ultrasound-guided intra-articular left glenohumeral injection.  She has rotator cuff tendinopathy, and is sent for glenohumeral injection in hopes of avoiding surgery.  Objective: Pain with overhead reach.  Procedure: Ultrasound guided injection is preferred based studies that show increased duration, increased effect, greater accuracy, decreased procedural pain, increased response rate, and decreased cost with ultrasound guided versus blind injection.   Verbal informed consent obtained.  Time-out conducted.  Noted no overlying erythema, induration, or other signs of local infection. Ultrasound-guided left glenohumeral injection: After sterile prep with Betadine, injected 4 cc 0.25% bupivocaine without epinephrine and 6 mg betamethasone using a 22-gauge spinal needle, passing the needle from posterior approach into the glenohumeral joint.  Injectate seen filling the joint capsule.

## 2020-08-20 ENCOUNTER — Telehealth: Payer: Self-pay | Admitting: Family Medicine

## 2020-08-20 ENCOUNTER — Other Ambulatory Visit: Payer: Self-pay

## 2020-08-20 ENCOUNTER — Ambulatory Visit
Admission: RE | Admit: 2020-08-20 | Discharge: 2020-08-20 | Disposition: A | Discharge status: Home / Self Care | Payer: 59 | Source: Ambulatory Visit | Attending: Family Medicine | Admitting: Family Medicine

## 2020-08-20 DIAGNOSIS — G8929 Other chronic pain: Secondary | ICD-10-CM

## 2020-08-20 NOTE — Telephone Encounter (Signed)
MRI shows a lateral meniscus tear with a cyst.  Will forward results to Dr. Roda Shutters.

## 2020-08-21 ENCOUNTER — Encounter: Payer: Self-pay | Admitting: Orthopaedic Surgery

## 2020-08-26 ENCOUNTER — Other Ambulatory Visit: Payer: 59

## 2020-08-27 ENCOUNTER — Telehealth: Payer: Self-pay

## 2020-08-27 NOTE — Telephone Encounter (Signed)
VOB submitted for Durolane, bilateral knee. Pending BV. 

## 2020-08-28 ENCOUNTER — Telehealth: Payer: Self-pay

## 2020-08-28 NOTE — Telephone Encounter (Signed)
Approved for Durolane, bilateral knee. Buy & Bill Must meet deductible first Patient will be responsible for 20% OOP. Co-pay of $50.00 No PA required

## 2020-09-04 ENCOUNTER — Encounter: Payer: Self-pay | Admitting: Orthopaedic Surgery

## 2020-09-04 ENCOUNTER — Other Ambulatory Visit: Payer: Self-pay

## 2020-09-04 ENCOUNTER — Ambulatory Visit (INDEPENDENT_AMBULATORY_CARE_PROVIDER_SITE_OTHER): Payer: 59 | Admitting: Orthopaedic Surgery

## 2020-09-04 VITALS — Ht 64.5 in | Wt 187.0 lb

## 2020-09-04 DIAGNOSIS — S83281A Other tear of lateral meniscus, current injury, right knee, initial encounter: Secondary | ICD-10-CM

## 2020-09-04 NOTE — Progress Notes (Signed)
Office Visit Note   Patient: Bonnie Gibson           Date of Birth: [birth year]           MRN: [id number] Visit Date: 09/04/2020              Requested by: Clovis Riley, L.August Saucer, MD [address] [place],  [place] [address] PCP: Clovis Riley, L.August Saucer, MD   Assessment & Plan: Visit Diagnoses:  1. Acute lateral meniscus tear of right knee, initial encounter     Plan: MRI consistent with a horizontal tear of the mid body and anterior horn of the lateral meniscus with parameniscal cyst.  Minimal chondromalacia.  Otherwise MRI is unremarkable.  Overall his pain seems to be somewhat out of proportion with MRI findings however with lack of relief from extensive conservative treatments to include rest, medications, knee brace, cortisone injections she has elected to undergo arthroscopic surgical debridement of lateral meniscus tear and parameniscal cyst.  She understands the risk of incomplete pain relief or no pain relief at all.  We also talked about the option of having her see a rheumatologist first to figure out why her pain is so severe but she opted to undergo the knee scope first.  We talked about risk benefits rehab recovery time out of work and time.  Questions encouraged and answered.  We will call her in the near future to schedule surgery.  Follow-Up Instructions: Return for Postop.   Orders:  No orders of the defined types were placed in this encounter.  No orders of the defined types were placed in this encounter.     Procedures: No procedures performed   Clinical Data: No additional findings.   Subjective: Chief Complaint  Patient presents with   Right Shoulder - Follow-up    MRI review    Bonnie Gibson follows up today for right knee MRI review.  She states that she continues to have lateral sided knee pain with activity and weightbearing.  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  She has no pain at rest or when she is nonweightbearing.  She  is continues to use Celebrex and Voltaren gel.   Review of Systems  Constitutional: Negative.   HENT: Negative.    Eyes: Negative.   Respiratory: Negative.    Cardiovascular: Negative.   Endocrine: Negative.   Musculoskeletal: Negative.   Neurological: Negative.   Hematological: Negative.   Psychiatric/Behavioral: Negative.    All other systems reviewed and are negative.   Objective: Vital Signs: Ht 5' 4.5" (1.638 m)   Wt 187 lb (84.8 kg)   LMP 03/25/2016 (Exact Date)   BMI 31.60 kg/m   Physical Exam Vitals and nursing note reviewed.  Constitutional:      Appearance: She is well-developed.  Pulmonary:     Effort: Pulmonary effort is normal.  Skin:    General: Skin is warm.     Capillary Refill: Capillary refill takes less than 2 seconds.  Neurological:     Mental Status: She is alert and oriented to person, place, and time.  Psychiatric:        Behavior: Behavior normal.        Thought Content: Thought content normal.        Judgment: Judgment normal.    Ortho Exam Right knee exam is unchanged. Specialty Comments:  No specialty comments available.  Imaging: No results found.   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   Past Medical History:  Diagnosis Date   Anxiety    Arthritis    Complication of anesthesia    hard to wake up post-op   Elevated white blood cell count    due to spherocytosis; states is usually 12,000   Fatty liver    Headache    Hereditary spherocytosis (HCC)    History of hiatal hernia    Hypertension    has not been taking med.; advised to resume medication as directed today  (08/30/2014)   Impingement syndrome of left shoulder 08/2014   PONV (postoperative nausea and vomiting)    Spherocytosis, hereditary (HCC)     Family History  Problem Relation Age of Onset   Heart defect Mother        MVP   Aneurysm Mother        Brain   Hypertension Mother    Diabetes Mother    Heart disease Father        CABG x 3   Hypertension Father    Cirrhosis Maternal Aunt    Diabetes Maternal Grandmother    Heart disease Paternal Grandfather        Needed pacemaker   Thyroid disease Neg Hx     Past Surgical History:  Procedure Laterality Date   BILATERAL SALPINGECTOMY N/A 04/02/2016   Procedure: BILATERAL SALPINGECTOMY;  Surgeon: Levi Aland, MD;  Location: [facility];  Service: Gynecology;  Laterality: N/A;   CHOLECYSTECTOMY N/A 03/19/2015   Procedure: LAPAROSCOPIC CHOLECYSTECTOMY WITH INTRAOPERATIVE CHOLANGIOGRAM;  Surgeon: Ovidio Kin, MD;  Location: [facility];  Service: General;  Laterality: N/A;   COLONOSCOPY     FINGER SURGERY Left    thumb   LAPAROSCOPIC ASSISTED VAGINAL HYSTERECTOMY N/A 04/02/2016   Procedure: LAPAROSCOPIC ASSISTED VAGINAL HYSTERECTOMY 120.4g ;  Surgeon: Levi Aland, MD;  Location: [facility];  Service: Gynecology;  Laterality: N/A;   MYRINGOTOMY     SHOULDER ARTHROSCOPY Right    SHOULDER ARTHROSCOPY WITH SUBACROMIAL DECOMPRESSION Left 09/05/2014   Procedure: SHOULDER ARTHROSCOPY WITH SUBACROMIAL DECOMPRESSION;  Surgeon: Tarry Kos, MD;  Location: [facility];  Service: Orthopedics;  Laterality: Left;   SPLENECTOMY, TOTAL     TONSILLECTOMY     TUBAL LIGATION  04/20/2002   UPPER GI ENDOSCOPY     WISDOM TOOTH EXTRACTION     Social History   Occupational History   Not on file  Tobacco Use   Smoking status: Never   Smokeless tobacco: Never  Substance and Sexual Activity   Alcohol use: Yes    Comment: occasionally   Drug use: No   Sexual activity: Yes    Birth control/protection: Surgical

## 2020-09-09 ENCOUNTER — Encounter: Payer: Self-pay | Admitting: Orthopaedic Surgery

## 2020-09-12 ENCOUNTER — Telehealth: Payer: Self-pay

## 2020-09-12 NOTE — Telephone Encounter (Signed)
Yes that's totally fine

## 2020-09-12 NOTE — Telephone Encounter (Signed)
See message from Dr. Roda Shutters.

## 2020-09-12 NOTE — Telephone Encounter (Signed)
See mssage from [place]. Please advise.   "Bonnie Gibson is scheduled for right knee scope PLM on 09-26-20 with Dr. Roda Shutters at [facility]. I have made a post op appointment for patient on 10-08-20 (which is 12 days out) because Both he and Bonnie Gibson are on vacation at 1 week out. Will this be ok...Marland Kitchenor do I need to have patient follow up with someone else....like Dr. Prince Rome (her PCP)? Please advise."

## 2020-09-13 ENCOUNTER — Encounter: Payer: Self-pay | Admitting: Orthopaedic Surgery

## 2020-09-13 ENCOUNTER — Other Ambulatory Visit: Payer: Self-pay

## 2020-09-13 NOTE — Telephone Encounter (Signed)
Can we either give her some from here or give her rx for some

## 2020-09-13 NOTE — Telephone Encounter (Signed)
I think crutches would be better especially if it is too painful to bear weight on that knee

## 2020-09-16 ENCOUNTER — Encounter: Payer: Self-pay | Admitting: Family Medicine

## 2020-09-16 MED ORDER — DIPHENOXYLATE-ATROPINE 2.5-0.025 MG PO TABS: 1.0000 | ORAL_TABLET | Freq: Four times a day (QID) | ORAL | 0 refills | Status: AC | PRN

## 2020-09-20 ENCOUNTER — Encounter: Payer: Self-pay | Admitting: Orthopaedic Surgery

## 2020-09-23 ENCOUNTER — Other Ambulatory Visit: Payer: Self-pay | Admitting: Physician Assistant

## 2020-09-23 MED ORDER — HYDROCODONE-ACETAMINOPHEN 5-325 MG PO TABS: 1.0000 | ORAL_TABLET | Freq: Three times a day (TID) | ORAL | 0 refills | Status: DC | PRN

## 2020-09-23 MED ORDER — ONDANSETRON HCL 4 MG PO TABS: 4.0000 mg | ORAL_TABLET | Freq: Three times a day (TID) | ORAL | 0 refills | Status: AC | PRN

## 2020-09-25 ENCOUNTER — Telehealth: Payer: Self-pay | Admitting: Orthopaedic Surgery

## 2020-09-25 NOTE — Telephone Encounter (Signed)
Pt called about rescheduling her appt with Dr.Newton.   CB [phone]

## 2020-09-26 ENCOUNTER — Ambulatory Visit: Payer: 59

## 2020-09-26 ENCOUNTER — Encounter: Payer: Self-pay | Admitting: Orthopaedic Surgery

## 2020-09-26 DIAGNOSIS — S83282A Other tear of lateral meniscus, current injury, left knee, initial encounter: Secondary | ICD-10-CM | POA: Diagnosis not present

## 2020-09-26 DIAGNOSIS — M23000 Cystic meniscus, unspecified lateral meniscus, right knee: Secondary | ICD-10-CM | POA: Diagnosis not present

## 2020-10-02 ENCOUNTER — Encounter: Payer: 59 | Admitting: Physical Medicine and Rehabilitation

## 2020-10-08 ENCOUNTER — Ambulatory Visit (INDEPENDENT_AMBULATORY_CARE_PROVIDER_SITE_OTHER): Payer: 59 | Admitting: Orthopaedic Surgery

## 2020-10-08 ENCOUNTER — Encounter: Payer: Self-pay | Admitting: Orthopaedic Surgery

## 2020-10-08 ENCOUNTER — Other Ambulatory Visit: Payer: Self-pay

## 2020-10-08 VITALS — Ht 64.5 in | Wt 187.0 lb

## 2020-10-08 DIAGNOSIS — S83281A Other tear of lateral meniscus, current injury, right knee, initial encounter: Secondary | ICD-10-CM

## 2020-10-08 NOTE — Progress Notes (Signed)
Post-Op Visit Note   Patient: Bonnie Gibson           Date of Birth: 1975/07/27           MRN: [id number] Visit Date: 10/08/2020 PCP: Clovis Riley, L.August Saucer, MD   Assessment & Plan:  Chief Complaint:  Chief Complaint  Patient presents with   Right Knee - Follow-up    Right knee scope 09/26/2020   Visit Diagnoses:  1. Acute lateral meniscus tear of right knee, initial encounter     Plan: Amaka is 12 days status post right knee arthroscopy lateral meniscus tear parameniscal cyst removal and chondroplasty.  She has not had much pain.  She is endorsing some mild symptoms consistent with postoperative knee scope.  Right knee surgical incisions are healed.  Sutures removed.  For the hyperextension that we recognized I would like to put her in a hinged knee brace to prevent this.  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  [employer] pamphlet provided for the left knee.  Recheck in 4 weeks.  Follow-Up Instructions: Return in about 4 weeks (around 11/05/2020).   Orders:  No orders of the defined types were placed in this encounter.  No orders of the defined types were placed in this encounter.   Imaging: No results found.  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   Past Medical History:  Diagnosis Date   Anxiety    Arthritis    Complication of anesthesia    hard to wake up post-op   Elevated white blood cell count    due to spherocytosis; states is usually 12,000   Fatty liver    Headache    Hereditary spherocytosis (HCC)    History of hiatal hernia    Hypertension     has not been taking med.; advised to resume medication as directed today (08/30/2014)   Impingement syndrome of left shoulder 08/2014   PONV (postoperative nausea and vomiting)    Spherocytosis, hereditary (HCC)     Family History  Problem Relation Age of Onset   Heart defect Mother        MVP   Aneurysm Mother        Brain   Hypertension Mother    Diabetes Mother    Heart disease Father        CABG x 3   Hypertension Father    Cirrhosis Maternal Aunt    Diabetes Maternal Grandmother    Heart disease Paternal Grandfather        Needed pacemaker   Thyroid disease Neg Hx     Past Surgical History:  Procedure Laterality Date   BILATERAL SALPINGECTOMY N/A 04/02/2016   Procedure: BILATERAL SALPINGECTOMY;  Surgeon: Levi Aland, MD;  Location: [facility];  Service: Gynecology;  Laterality: N/A;   CHOLECYSTECTOMY N/A 03/19/2015   Procedure: LAPAROSCOPIC CHOLECYSTECTOMY WITH INTRAOPERATIVE CHOLANGIOGRAM;  Surgeon: Ovidio Kin, MD;  Location: [facility];  Service: General;  Laterality: N/A;   COLONOSCOPY     FINGER SURGERY Left    thumb   LAPAROSCOPIC ASSISTED VAGINAL HYSTERECTOMY N/A 04/02/2016   Procedure: LAPAROSCOPIC ASSISTED  VAGINAL HYSTERECTOMY 120.4g ;  Surgeon: Levi Aland, MD;  Location: [facility];  Service: Gynecology;  Laterality: N/A;   MYRINGOTOMY     SHOULDER ARTHROSCOPY Right    SHOULDER ARTHROSCOPY WITH SUBACROMIAL DECOMPRESSION Left 09/05/2014   Procedure: SHOULDER ARTHROSCOPY WITH SUBACROMIAL DECOMPRESSION;  Surgeon: Tarry Kos, MD;  [facility];  Service: Orthopedics;  Laterality: Left;   SPLENECTOMY, TOTAL     TONSILLECTOMY     TUBAL LIGATION  04/20/2002   UPPER GI ENDOSCOPY     WISDOM TOOTH EXTRACTION     Social History   Occupational History   Not on file  Tobacco Use   Smoking status: Never   Smokeless tobacco: Never  Substance and Sexual Activity   Alcohol use: Yes    Comment: occasionally   Drug use: No   Sexual activity: Yes     Birth control/protection: Surgical

## 2020-10-08 NOTE — Addendum Note (Signed)
Addended by: Albertina Parr on: 10/08/2020 01:02 PM   Modules accepted: Orders

## 2020-10-09 LAB — RHEUMATOID FACTOR: Rheumatoid fact SerPl-aCnc: <14 IU/mL (ref <14)

## 2020-10-09 LAB — SEDIMENTATION RATE: Sed Rate: 6 mm/h (ref 0–20)

## 2020-10-09 LAB — URIC ACID: Uric Acid, Serum: 5.5 mg/dL (ref 2.5–7.0)

## 2020-10-09 LAB — C-REACTIVE PROTEIN: CRP: 4.4 mg/L (ref <8.0)

## 2020-10-15 ENCOUNTER — Encounter: Payer: Self-pay | Admitting: Orthopaedic Surgery

## 2020-10-22 ENCOUNTER — Telehealth: Payer: Self-pay

## 2020-10-22 ENCOUNTER — Encounter: Payer: Self-pay | Admitting: Orthopaedic Surgery

## 2020-10-22 NOTE — Telephone Encounter (Addendum)
Will submit for gel injection, once I receive clarification from patient for the correct knee.    Submitted for Durolane, Left knee. Pending BV.

## 2020-10-23 ENCOUNTER — Encounter: Payer: Self-pay | Admitting: Orthopaedic Surgery

## 2020-10-23 NOTE — Telephone Encounter (Signed)
Yeah that's fine to release her back to work

## 2020-10-23 NOTE — Telephone Encounter (Signed)
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  Are you ok with her going back sooner?

## 2020-10-24 NOTE — Telephone Encounter (Signed)
Ok to release back to work

## 2020-10-25 ENCOUNTER — Other Ambulatory Visit: Payer: Self-pay

## 2020-10-25 ENCOUNTER — Telehealth: Payer: Self-pay

## 2020-10-25 MED ORDER — CELECOXIB 200 MG PO CAPS: 200.0000 mg | ORAL_CAPSULE | Freq: Every day | ORAL | 6 refills | Status: DC

## 2020-10-25 NOTE — Telephone Encounter (Signed)
Yes that is fine.  60 tablets with 6 refills.

## 2020-10-25 NOTE — Telephone Encounter (Signed)
Requests refill of celebrex

## 2020-10-28 ENCOUNTER — Telehealth: Payer: Self-pay

## 2020-10-28 NOTE — Telephone Encounter (Signed)
Approved for Durolane, left knee. Buy & Bill Must meet deductible first Patient will be responsible for 20% OOP. Co-pay of $50.00 No PA required  Appt. 11/06/2020 with Dr. Roda Shutters

## 2020-11-06 ENCOUNTER — Other Ambulatory Visit: Payer: Self-pay

## 2020-11-06 ENCOUNTER — Encounter: Payer: Self-pay | Admitting: Orthopaedic Surgery

## 2020-11-06 ENCOUNTER — Ambulatory Visit: Payer: 59 | Admitting: Orthopaedic Surgery

## 2020-11-06 DIAGNOSIS — M7052 Other bursitis of knee, left knee: Secondary | ICD-10-CM

## 2020-11-06 DIAGNOSIS — M1712 Unilateral primary osteoarthritis, left knee: Secondary | ICD-10-CM | POA: Diagnosis not present

## 2020-11-06 MED ORDER — METHYLPREDNISOLONE ACETATE 40 MG/ML IJ SUSP
40.0000 mg | INTRAMUSCULAR | Status: AC | PRN
  Administered 2020-11-06: 40 mg via INTRA_ARTICULAR

## 2020-11-06 MED ORDER — BUPIVACAINE HCL 0.5 % IJ SOLN
2.0000 mL | INTRAMUSCULAR | Status: AC | PRN
  Administered 2020-11-06: 2 mL via INTRA_ARTICULAR

## 2020-11-06 MED ORDER — LIDOCAINE HCL 1 % IJ SOLN
2.0000 mL | INTRAMUSCULAR | Status: AC | PRN
  Administered 2020-11-06: 2 mL

## 2020-11-06 MED ORDER — SODIUM HYALURONATE 60 MG/3ML IX PRSY
60.0000 mg | PREFILLED_SYRINGE | INTRA_ARTICULAR | Status: AC | PRN
  Administered 2020-11-06: 60 mg via INTRA_ARTICULAR

## 2020-11-06 NOTE — Progress Notes (Signed)
Office Visit Note   Patient: Bonnie Gibson           Date of Birth: Oct 28, 1975           MRN: [id number] Visit Date: 11/06/2020              Requested by: Clovis Riley, L.August Saucer, MD [address] [place],  [place] [address] PCP: Clovis Riley, L.August Saucer, MD   Assessment & Plan: Visit Diagnoses:  1. Primary osteoarthritis of left knee   2. Pes anserinus bursitis of left knee     Plan: Based on findings impression is left knee osteoarthritis chondromalacia as well as new problem of Pez anserine bursitis.  Durolane was injected into the left knee joint today.  We also performed a separate cortisone injection into the pes bursa.  She tolerated both injections well today.  Follow-up as needed.  Follow-Up Instructions: No follow-ups on file.   Orders:  No orders of the defined types were placed in this encounter.  No orders of the defined types were placed in this encounter.     Procedures: Large Joint Inj: L knee on 11/06/2020 4:28 PM Details: 22 G needle Medications: 2 mL bupivacaine 0.5 %; 2 mL lidocaine 1 %; 60 mg Sodium Hyaluronate 60 MG/3ML Outcome: tolerated well, no immediate complications Patient was prepped and draped in the usual sterile fashion.    Large Joint Inj: L knee on 11/06/2020 4:28 PM Details: 22 G needle Medications: 2 mL bupivacaine 0.5 %; 2 mL lidocaine 1 %; 40 mg methylPREDNISolone acetate 40 MG/ML Outcome: tolerated well, no immediate complications Patient was prepped and draped in the usual sterile fashion.      Clinical Data: No additional findings.   Subjective: Chief Complaint  Patient presents with  . Left Knee - Follow-up    HPI  Bonnie Gibson returns today for planned left knee Durolane injection.  She is also complaining of a different knee pain on the medial side.  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  She has recovered from her right knee scope well.  Review of Systems   Objective: Vital Signs: LMP 03/25/2016  (Exact Date)   Physical Exam  Ortho Exam  Left knee shows significant tenderness over the pes bursa.  No joint line tenderness.  Specialty Comments:  No specialty comments available.  Imaging: No results found.   PMFS History: Patient Active Problem List   Diagnosis Date Noted  . Pes anserinus bursitis of left knee 11/06/2020  . Primary osteoarthritis of left knee 11/06/2020  . Hyperlipidemia 04/21/2019  . History of splenectomy 10/11/2017  . Fatty liver disease, nonalcoholic 10/08/2017  . Hereditary spherocytosis (HCC) 10/08/2017  . Hypertensive disorder 10/08/2017  . Anxiety disorder 10/08/2017  . Obesity (BMI 30.0-34.9) 10/08/2017  . Pelvic pain in female 04/02/2016  . 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 Anxiety   . Arthritis   . Complication of anesthesia    hard to wake up post-op  . Elevated white blood cell count    due to spherocytosis; states is usually 12,000  . Fatty liver   . Headache   . Hereditary spherocytosis (HCC)   . History of hiatal hernia   . Hypertension    has not been taking med.; advised to resume medication as directed today (08/30/2014)  . Impingement syndrome of left shoulder 08/2014  . PONV (postoperative nausea and vomiting)   . Spherocytosis, hereditary (HCC)  Family History  Problem Relation Age of Onset  . Heart defect Mother        MVP  . Aneurysm Mother        Brain  . Hypertension Mother   . Diabetes Mother   . Heart disease Father        CABG x 3  . Hypertension Father   . Cirrhosis Maternal Aunt   . Diabetes Maternal Grandmother   . Heart disease Paternal Grandfather        Needed pacemaker  . Thyroid disease Neg Hx     Past Surgical History:  Procedure Laterality Date  . BILATERAL SALPINGECTOMY N/A 04/02/2016   Procedure: BILATERAL SALPINGECTOMY;  Surgeon: Levi Aland, MD;  Location: [facility];  Service: Gynecology;  Laterality: N/A;  . CHOLECYSTECTOMY N/A 03/19/2015    Procedure: LAPAROSCOPIC CHOLECYSTECTOMY WITH INTRAOPERATIVE CHOLANGIOGRAM;  Surgeon: Ovidio Kin, MD;  Location: [facility] OR;  Service: General;  Laterality: N/A;  . COLONOSCOPY    . FINGER SURGERY Left    thumb  . LAPAROSCOPIC ASSISTED VAGINAL HYSTERECTOMY N/A 04/02/2016   Procedure: LAPAROSCOPIC ASSISTED VAGINAL HYSTERECTOMY 120.4g ;  Surgeon: Levi Aland, MD;  Location: [facility];  Service: Gynecology;  Laterality: N/A;  . MYRINGOTOMY    . SHOULDER ARTHROSCOPY Right   . SHOULDER ARTHROSCOPY WITH SUBACROMIAL DECOMPRESSION Left 09/05/2014   Procedure: SHOULDER ARTHROSCOPY WITH SUBACROMIAL DECOMPRESSION;  Surgeon: Tarry Kos, MD;  Location: [facility];  Service: Orthopedics;  Laterality: Left;  . SPLENECTOMY, TOTAL    . TONSILLECTOMY    . TUBAL LIGATION  04/20/2002  . UPPER GI ENDOSCOPY    . WISDOM TOOTH EXTRACTION     Social History   Occupational History  . Not on file  Tobacco Use  . Smoking status: Never  . Smokeless tobacco: Never  Substance and Sexual Activity  . Alcohol use: Yes    Comment: occasionally  . Drug use: No  . Sexual activity: Yes    Birth control/protection: Surgical

## 2020-11-20 ENCOUNTER — Encounter: Payer: 59 | Admitting: Physical Medicine and Rehabilitation

## 2020-12-04 ENCOUNTER — Telehealth: Payer: Self-pay | Admitting: Physical Medicine and Rehabilitation

## 2020-12-04 NOTE — Telephone Encounter (Signed)
Returned patient's call and rescheduled appointment. 

## 2020-12-04 NOTE — Telephone Encounter (Signed)
Pt returned call .   CB [phone]

## 2020-12-11 ENCOUNTER — Encounter: Payer: 59 | Admitting: Physical Medicine and Rehabilitation

## 2020-12-17 ENCOUNTER — Telehealth: Payer: Self-pay | Admitting: Physical Medicine and Rehabilitation

## 2020-12-17 NOTE — Telephone Encounter (Signed)
Left message #1 to reschedule. 

## 2020-12-17 NOTE — Telephone Encounter (Signed)
Patient called needing to reschedule her appointment due to her not feeling well. The number to contact patient is [phone]

## 2020-12-18 ENCOUNTER — Encounter: Payer: 59 | Admitting: Physical Medicine and Rehabilitation

## 2020-12-18 NOTE — Telephone Encounter (Signed)
Cancelled appointment and will await call back from patient to reschedule.

## 2021-01-22 ENCOUNTER — Telehealth: Payer: Self-pay | Admitting: Family Medicine

## 2021-01-22 NOTE — Telephone Encounter (Signed)
Medical records emailed to [facility]. 

## 2021-04-17 ENCOUNTER — Other Ambulatory Visit: Payer: Self-pay | Admitting: Family Medicine

## 2021-04-17 DIAGNOSIS — R29898 Other symptoms and signs involving the musculoskeletal system: Secondary | ICD-10-CM

## 2021-04-17 DIAGNOSIS — M25561 Pain in right knee: Secondary | ICD-10-CM

## 2021-04-17 DIAGNOSIS — M2391 Unspecified internal derangement of right knee: Secondary | ICD-10-CM

## 2021-04-21 ENCOUNTER — Other Ambulatory Visit: Payer: Self-pay

## 2021-04-21 ENCOUNTER — Ambulatory Visit
Admission: RE | Admit: 2021-04-21 | Discharge: 2021-04-21 | Disposition: A | Discharge status: Home / Self Care | Payer: 59 | Source: Ambulatory Visit | Attending: Family Medicine | Admitting: Family Medicine

## 2021-04-21 DIAGNOSIS — M25561 Pain in right knee: Secondary | ICD-10-CM

## 2021-04-21 DIAGNOSIS — M2391 Unspecified internal derangement of right knee: Secondary | ICD-10-CM

## 2021-04-21 DIAGNOSIS — R29898 Other symptoms and signs involving the musculoskeletal system: Secondary | ICD-10-CM

## 2021-05-01 ENCOUNTER — Other Ambulatory Visit: Payer: Self-pay

## 2021-06-25 ENCOUNTER — Ambulatory Visit (INDEPENDENT_AMBULATORY_CARE_PROVIDER_SITE_OTHER): Payer: 59 | Admitting: Orthopedic Surgery

## 2021-06-25 ENCOUNTER — Encounter: Payer: Self-pay | Admitting: Orthopedic Surgery

## 2021-06-25 DIAGNOSIS — M1711 Unilateral primary osteoarthritis, right knee: Secondary | ICD-10-CM

## 2021-06-25 DIAGNOSIS — M1712 Unilateral primary osteoarthritis, left knee: Secondary | ICD-10-CM

## 2021-06-25 NOTE — Progress Notes (Signed)
? ?Office Visit Note ?  ?Patient: Bonnie Gibson           ?Date of Birth: 1975/05/30           ?MRN: [id number] ?Visit Date: 06/25/2021 ?Requested by: Clovis Riley, L.August Saucer, MD ?[address] ?[address] ?[place],  [place] [address] ?PCP: Clovis Riley, L.August Saucer, MD ? ?Subjective: ?Chief Complaint  ?Patient presents with  ? Right Knee - Pain  ? ? ?HPI: Bonnie Gibson is a 46 year old female with right knee pain.  She underwent right knee arthroscopy and lateral partial meniscectomy and cyst decompression in July 2016.  Had persistent pain after that surgery.  Old operative notes and pictures are reviewed.  Patient had fairly significant lateral compartment arthritis at the time as well as a significant anterior horn lateral meniscal tear.  Repeat MRI scan after persistent symptoms performed earlier this year.  That shows significant arthritis in the lateral compartment.  She has had multiple injections including the last one in January which gave her 2 weeks of improvement.  She does report some mechanical symptoms in that right knee and is having some left knee pain as well.  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  Takes Celebrex and/or Voltaren for her pain.  Describes primarily lateral sided pain in the right knee.  She has been using a brace but the brace hurts the knee.  [place]  Is looking for some type of intervention in winter of this year.  No personal or family history of DVT or pulmonary embolism. ?             ?ROS: All systems reviewed are negative as they relate to the chief complaint within the history of present illness.  Patient denies  fevers or chills. ? ? ?Assessment & Plan: ?Visit Diagnoses:  ?1. Primary osteoarthritis of left knee   ?2. Primary osteoarthritis of right knee   ? ? ?Plan: Impression is progressive right knee lateral compartment arthritis following anterior horn lateral meniscectomy.  Both MRI scans are reviewed.  She has a little bit of chondral  thinning medially in the most recent MRI scan compared to the scan from 7 years ago.  No effusion today.  Primarily lateral sided symptoms.  Medication is helping only marginally.  She has some pain with prolonged sitting as well as pain with prolonged ambulation and standing.  Options available to [facility] include episodic injections as well as quad strengthening and body mass index optimization.  Second option would be partial knee replacement in the lateral compartment.  Third option would be press-fit total knee replacement.  The risks and benefits of each of those options are discussed at length with Marchelle Folks.  In general I do not think continued injections are a tenable option because of incomplete pain relief and short duration of pain relief from the shots.  She does have a bipolar lesion in that lateral compartment affecting the tibia and femur.  Meniscal transplant not an option at this time.  Arthroplasty options include lateral unicompartmental arthroplasty.  That would have the advantage of a more normal feeling the but may not have the same longevity as a press-fit total knee replacement.  Revising that lateral unicompartmental knee would essentially and most likely put cemented stemmed revision components into the knee when it fails.  Press-fit knee replacement is not a great option for someone so young; however I think it is most likely a more durable procedure  than the unicompartmental replacement for this patient.  Discussed with her the 3 options.  The data on press-fit unicompartmental knee replacements does not have the same long timeframe as for cemented unicompartmental knee replacements.  Based on several revisions of unicompartmental knee replacements I am leaning towards recommending total knee replacement to minimize the chances of her requiring a revision within 5 to 10 years.  Plan to see her back in about 4 months for further discussions on the matter. ?Follow-Up Instructions: Return in  about 4 months (around 10/26/2021).  ? ?Orders:  ?No orders of the defined types were placed in this encounter. ? ?No orders of the defined types were placed in this encounter. ? ? ? ? Procedures: ?No procedures performed ? ? ?Clinical Data: ?No additional findings. ? ?Objective: ?Vital Signs: LMP 03/25/2016 (Exact Date)  ? ?Physical Exam:  ? ?Constitutional: Patient appears well-developed ?HEENT:  ?Head: Normocephalic ?Eyes:EOM are normal ?Neck: Normal range of motion ?Cardiovascular: Normal rate ?Pulmonary/chest: Effort normal ?Neurologic: Patient is alert ?Skin: Skin is warm ?Psychiatric: Patient has normal mood and affect ? ? ?Ortho Exam: Ortho exam demonstrates normal gait and alignment.  No effusion in either knee.  Both knees have full range of motion.  Collateral crucial ligaments are stable particularly the ACL on the right-hand side.  She does have appropriate varus and valgus laxity to varus and valgus stress at 0 and 30 degrees.  Patella tracks normally with no crepitus.  Pedal pulses palpable. ? ?Specialty Comments:  ?No specialty comments available. ? ?Imaging: ?No results found. ? ? ?PMFS History: ?Patient Active Problem List  ? Diagnosis Date Noted  ? Pes anserinus bursitis of left knee 11/06/2020  ? Primary osteoarthritis of left knee 11/06/2020  ? Hyperlipidemia 04/21/2019  ? History of splenectomy 10/11/2017  ? Fatty liver disease, nonalcoholic 10/08/2017  ? Hereditary spherocytosis (HCC) 10/08/2017  ? Hypertensive disorder 10/08/2017  ? Anxiety disorder 10/08/2017  ? Obesity (BMI 30.0-34.9) 10/08/2017  ? Pelvic pain in female 04/02/2016  ? Gall bladder disease 03/19/2015  ? ?Past Medical History:  ?Diagnosis Date  ? Anxiety   ? Arthritis   ? Complication of anesthesia   ? hard to wake up post-op  ? Elevated white blood cell count   ? due to spherocytosis; states is usually 12,000  ? Fatty liver   ? Headache   ? Hereditary spherocytosis (HCC)   ? History of hiatal hernia   ? Hypertension   ? has  not been taking med.; advised to resume medication as directed today (08/30/2014)  ? Impingement syndrome of left shoulder 08/2014  ? PONV (postoperative nausea and vomiting)   ? Spherocytosis, hereditary (HCC)   ?  ?Family History  ?Problem Relation Age of Onset  ? Heart defect Mother   ?     MVP  ? Aneurysm Mother   ?     Brain  ? Hypertension Mother   ? Diabetes Mother   ? Heart disease Father   ?     CABG x 3  ? Hypertension Father   ? Cirrhosis Maternal Aunt   ? Diabetes Maternal Grandmother   ? Heart disease Paternal Grandfather   ?     Needed pacemaker  ? Thyroid disease Neg Hx   ?  ?Past Surgical History:  ?Procedure Laterality Date  ? BILATERAL SALPINGECTOMY N/A 04/02/2016  ? Procedure: BILATERAL SALPINGECTOMY;  Surgeon: Levi AlandMark E Anderson, MD;  Location: [facility];  Service: Gynecology;  Laterality: N/A;  ? CHOLECYSTECTOMY N/A 03/19/2015  ?  Procedure: LAPAROSCOPIC CHOLECYSTECTOMY WITH INTRAOPERATIVE CHOLANGIOGRAM;  Surgeon: Ovidio Kin, MD;  Location: [facility] OR;  Service: General;  Laterality: N/A;  ? COLONOSCOPY    ? FINGER SURGERY Left   ? thumb  ? LAPAROSCOPIC ASSISTED VAGINAL HYSTERECTOMY N/A 04/02/2016  ? Procedure: LAPAROSCOPIC ASSISTED VAGINAL HYSTERECTOMY 120.4g ;  Surgeon: Levi Aland, MD;  Location: [facility];  Service: Gynecology;  Laterality: N/A;  ? MYRINGOTOMY    ? SHOULDER ARTHROSCOPY Right   ? SHOULDER ARTHROSCOPY WITH SUBACROMIAL DECOMPRESSION Left 09/05/2014  ? Procedure: SHOULDER ARTHROSCOPY WITH SUBACROMIAL DECOMPRESSION;  Surgeon: Tarry Kos, MD;  Location: [facility];  Service: Orthopedics;  Laterality: Left;  ? SPLENECTOMY, TOTAL    ? TONSILLECTOMY    ? TUBAL LIGATION  04/20/2002  ? UPPER GI ENDOSCOPY    ? WISDOM TOOTH EXTRACTION    ? ?Social History  ? ?Occupational History  ? Not on file  ?Tobacco Use  ? Smoking status: Never  ? Smokeless tobacco: Never  ?Substance and Sexual Activity  ? Alcohol use: Yes  ?  Comment: occasionally  ? Drug use: No  ? Sexual activity: Yes  ?  Birth  control/protection: Surgical  ? ? ? ? ? ?

## 2021-12-01 ENCOUNTER — Other Ambulatory Visit: Payer: Self-pay | Admitting: Family Medicine

## 2021-12-01 DIAGNOSIS — M542 Cervicalgia: Secondary | ICD-10-CM

## 2021-12-16 ENCOUNTER — Other Ambulatory Visit: Payer: 59

## 2021-12-18 ENCOUNTER — Other Ambulatory Visit: Payer: 59

## 2022-01-05 ENCOUNTER — Ambulatory Visit
Admission: RE | Admit: 2022-01-05 | Discharge: 2022-01-05 | Disposition: A | Discharge status: Home / Self Care | Payer: No Typology Code available for payment source | Source: Ambulatory Visit | Attending: Family Medicine | Admitting: Family Medicine

## 2022-01-05 ENCOUNTER — Other Ambulatory Visit: Payer: Self-pay | Admitting: Family Medicine

## 2022-01-05 DIAGNOSIS — M542 Cervicalgia: Secondary | ICD-10-CM

## 2022-02-26 ENCOUNTER — Other Ambulatory Visit: Payer: Self-pay | Admitting: Family Medicine

## 2022-02-26 DIAGNOSIS — M25562 Pain in left knee: Secondary | ICD-10-CM

## 2022-03-06 ENCOUNTER — Ambulatory Visit
Admission: RE | Admit: 2022-03-06 | Discharge: 2022-03-06 | Disposition: A | Discharge status: Home / Self Care | Payer: 59 | Source: Ambulatory Visit | Attending: Family Medicine | Admitting: Family Medicine

## 2022-03-06 DIAGNOSIS — M25562 Pain in left knee: Secondary | ICD-10-CM

## 2022-05-11 ENCOUNTER — Ambulatory Visit: Payer: 59 | Admitting: Orthopedic Surgery

## 2022-05-11 DIAGNOSIS — M1711 Unilateral primary osteoarthritis, right knee: Secondary | ICD-10-CM

## 2022-05-11 DIAGNOSIS — M1712 Unilateral primary osteoarthritis, left knee: Secondary | ICD-10-CM

## 2022-05-17 ENCOUNTER — Encounter: Payer: Self-pay | Admitting: Orthopedic Surgery

## 2022-05-17 NOTE — Progress Notes (Signed)
Office Visit Note   Patient: Bonnie Gibson           Date of Birth: 17-Dec-1975           MRN: [id number] Visit Date: 05/11/2022 Requested by: Alroy Dust, L.Marlou Sa, [place] [address] [place],  [place] [address] PCP: Alroy Dust, L.Marlou Sa, MD  Subjective: Chief Complaint  Patient presents with   Left Knee - Pain    HPI: Bonnie Gibson is a 47 y.o. female who presents to the office reporting left knee pain.  No recent injury.  She does report a prior injury in 2021.  Describes worsening pain for the past year.  Pain wakes her from sleep at night.  She does report increased pain with activity.  Takes knee" and Tylenol without much relief.  Localizes pain to the posterolateral aspect of the knee.  Swells with activity.  Really cannot go to the grocery store.  She has 20 minutes of walking endurance.  She works as a Publishing copy.  Right knee has done okay with injections.  She had 3 injections last year..                ROS: All systems reviewed are negative as they relate to the chief complaint within the history of present illness.  Patient denies fevers or chills.  Assessment & Plan: Visit Diagnoses:  1. Primary osteoarthritis of left knee   2. Primary osteoarthritis of right knee     Plan: Impression is left knee lateral meniscal tear without too much arthritis in the rest of the knee.  MRI scan is reviewed with the patient.  Plan is left knee arthroscopy and debridement.  The risk and benefits are discussed with patient including not limited to infection or vessel damage knee stiffness as well as incomplete pain relief and incomplete restoration of function.  In general knee arthroscopy in this clinical scenario is predictable for some but not complete pain relief.  She may develop arthritis at the level proportional to the amount of torn meniscus which requires removal.  Patient understands the risk and benefits and wishes to proceed.  All questions answered.  Follow-Up  Instructions: No follow-ups on file.   Orders:  No orders of the defined types were placed in this encounter.  No orders of the defined types were placed in this encounter.     Procedures: No procedures performed   Clinical Data: No additional findings.  Objective: Vital Signs: LMP 03/25/2016 (Exact Date)   Physical Exam:  Constitutional: Patient appears well-developed HEENT:  Head: Normocephalic Eyes:EOM are normal Neck: Normal range of motion Cardiovascular: Normal rate Pulmonary/chest: Effort normal Neurologic: Patient is alert Skin: Skin is warm Psychiatric: Patient has normal mood and affect  Ortho Exam: Ortho exam demonstrates full active and passive range of motion of that left knee.  Trace effusion is present.  Collateral crucial ligaments are stable.  Lateral greater than medial joint line tenderness with positive Murray compression testing or lateral compartment pathology in the left knee.  No calf tenderness.  No Baker's cyst is present.  No other masses lymphadenopathy or skin changes noted in that left knee region.  Specialty Comments:  No specialty comments available.  Imaging: No results found.   PMFS History: Patient Active Problem List   Diagnosis Date Noted   Pes anserinus bursitis of left knee 11/06/2020   Primary osteoarthritis of left knee 11/06/2020   Hyperlipidemia 04/21/2019   History of splenectomy 10/11/2017   Fatty  liver disease, nonalcoholic [id number]   Hereditary spherocytosis ([place]) 10/08/2017   Hypertensive disorder 10/08/2017   Anxiety disorder 10/08/2017   Obesity (BMI 30.0-34.9) 10/08/2017   Pelvic pain in female 04/02/2016   Gall bladder disease 03/19/2015   Past Medical History:  Diagnosis Date   Anxiety    Arthritis    Complication of anesthesia    hard to wake up post-op   Elevated white blood cell count    due to spherocytosis; states is usually 12,000   Fatty liver    Headache    Hereditary spherocytosis (HCC)     History of hiatal hernia    Hypertension    has not been taking med.; advised to resume medication as directed today (08/30/2014)   Impingement syndrome of left shoulder 08/2014   PONV (postoperative nausea and vomiting)    Spherocytosis, hereditary ([place])     Family History  Problem Relation Age of Onset   Heart defect Mother        MVP   Aneurysm Mother        Brain   Hypertension Mother    Diabetes Mother    Heart disease Father        CABG x 3   Hypertension Father    Cirrhosis Maternal Aunt    Diabetes Maternal Grandmother    Heart disease Paternal Grandfather        Needed pacemaker   Thyroid disease Neg Hx     Past Surgical History:  Procedure Laterality Date   BILATERAL SALPINGECTOMY N/A 04/02/2016   Procedure: BILATERAL SALPINGECTOMY;  Surgeon: Olga Millers, MD;  Location: [facility];  Service: Gynecology;  Laterality: N/A;   CHOLECYSTECTOMY N/A 03/19/2015   Procedure: LAPAROSCOPIC CHOLECYSTECTOMY WITH INTRAOPERATIVE CHOLANGIOGRAM;  Surgeon: Alphonsa Overall, MD;  Location: [facility];  Service: General;  Laterality: N/A;   COLONOSCOPY     FINGER SURGERY Left    thumb   LAPAROSCOPIC ASSISTED VAGINAL HYSTERECTOMY N/A 04/02/2016   Procedure: LAPAROSCOPIC ASSISTED VAGINAL HYSTERECTOMY 120.4g ;  Surgeon: Olga Millers, MD;  Location: [facility];  Service: Gynecology;  Laterality: N/A;   MYRINGOTOMY     SHOULDER ARTHROSCOPY Right    SHOULDER ARTHROSCOPY WITH SUBACROMIAL DECOMPRESSION Left 09/05/2014   Procedure: SHOULDER ARTHROSCOPY WITH SUBACROMIAL DECOMPRESSION;  Surgeon: Leandrew Koyanagi, MD;  Location: [facility];  Service: Orthopedics;  Laterality: Left;   SPLENECTOMY, TOTAL     TONSILLECTOMY     TUBAL LIGATION  04/20/2002   UPPER GI ENDOSCOPY     WISDOM TOOTH EXTRACTION     Social History   Occupational History   Not on file  Tobacco Use   Smoking status: Never   Smokeless tobacco: Never  Substance and Sexual Activity   Alcohol use: Yes    Comment:  occasionally   Drug use: No   Sexual activity: Yes    Birth control/protection: Surgical

## 2022-05-18 ENCOUNTER — Ambulatory Visit: Payer: 59 | Admitting: Orthopedic Surgery

## 2022-05-18 ENCOUNTER — Telehealth: Payer: Self-pay

## 2022-05-18 NOTE — Telephone Encounter (Signed)
-----   Message from Meredith Pel, MD sent at 05/17/2022  8:39 PM EDT ----- Corrin Parker.  Can you see if Kloei Schwander for left knee arthroscopy and debridement.  I am not sure if she was supposed to be tested now or 4 weeks from now.  Thank

## 2022-05-18 NOTE — Telephone Encounter (Signed)
thx

## 2022-05-18 NOTE — Telephone Encounter (Signed)
Please see note from Dr Leanor Rubenstein advise if patient is scheduled?

## 2022-06-22 ENCOUNTER — Encounter: Payer: Self-pay | Admitting: Orthopedic Surgery

## 2022-06-22 ENCOUNTER — Other Ambulatory Visit: Payer: Self-pay | Admitting: Surgical

## 2022-06-22 DIAGNOSIS — S83272D Complex tear of lateral meniscus, current injury, left knee, subsequent encounter: Secondary | ICD-10-CM

## 2022-06-22 MED ORDER — CELECOXIB 100 MG PO CAPS: 100.0000 mg | ORAL_CAPSULE | Freq: Two times a day (BID) | ORAL | 0 refills | Status: AC

## 2022-06-22 MED ORDER — ASPIRIN 81 MG PO CHEW: 81.0000 mg | CHEWABLE_TABLET | Freq: Two times a day (BID) | ORAL | 0 refills | Status: AC

## 2022-06-22 MED ORDER — OXYCODONE HCL 5 MG PO TABS: 5.0000 mg | ORAL_TABLET | ORAL | 0 refills | Status: AC | PRN

## 2022-06-29 ENCOUNTER — Encounter: Payer: Self-pay | Admitting: Surgical

## 2022-06-29 ENCOUNTER — Ambulatory Visit (INDEPENDENT_AMBULATORY_CARE_PROVIDER_SITE_OTHER): Payer: 59 | Admitting: Surgical

## 2022-06-29 DIAGNOSIS — Z9889 Other specified postprocedural states: Secondary | ICD-10-CM

## 2022-06-30 ENCOUNTER — Encounter: Payer: Self-pay | Admitting: Surgical

## 2022-06-30 NOTE — Progress Notes (Signed)
Post-Op Visit Note   Patient: Bonnie Gibson           Date of Birth: 12-23-1975           MRN: [id number] Visit Date: 06/29/2022 PCP: Clovis Riley, L.August Saucer, MD   Assessment & Plan:  Chief Complaint:  Chief Complaint  Patient presents with   Left Knee - Routine Post Op    06/22/2022 left knee arthroscopy, PLM   Visit Diagnoses: No diagnosis found.  Plan: Patient is a 47 year old female who presents s/p left knee arthroscopy with lateral meniscal debridement on 06/22/2022.  She is about a week out from surgery.  She states that she was having a lot of pain causing difficulty with weightbearing with her worst day being last Thursday.  She feels like she turned a corner on Friday and pain has been improving since then.  She is ambulating with crutches.  Taking Tylenol and Relafen for pain control.  She is taking aspirin for DVT prophylaxis.  Denies any fevers, chills, chest pain, shortness of breath, calf pain.  On exam, patient has 0 degrees extension and 90 degrees of knee flexion.  She has increased pain with flexion past 90 degrees.  No calf tenderness.  Negative Homans' sign.  Palpable DP pulse of the operative extremity.  She has moderate effusion noted in the knee.  Incisions are healing well with sutures intact.  The sutures were removed and replaced with Steri-Strips today.  She is able to perform straight leg raise without extensor lag.  Plan is to remove sutures today.  Continue with aspirin for DVT prophylaxis until she is walking completely normally.  She will do stationary bike and straight leg raises for rehab exercises at home.  Follow-up in 4 weeks for clinical recheck with Dr. August Saucer.  Call with any concerns in the meantime.  Follow-Up Instructions: No follow-ups on file.   Orders:  No orders of the defined types were placed in this encounter.  No orders of the defined types were placed in this encounter.   Imaging: No results found.  PMFS History: Patient Active Problem List    Diagnosis Date Noted   Pes anserinus bursitis of left knee 11/06/2020   Primary osteoarthritis of left knee 11/06/2020   Hyperlipidemia 04/21/2019   History of splenectomy 10/11/2017   Fatty liver disease, nonalcoholic 10/08/2017   Hereditary spherocytosis (HCC) 10/08/2017   Hypertensive disorder 10/08/2017   Anxiety disorder 10/08/2017   Obesity (BMI 30.0-34.9) 10/08/2017   Pelvic pain in female 04/02/2016   Gall bladder disease 03/19/2015   Past Medical History:  Diagnosis Date   Anxiety    Arthritis    Complication of anesthesia    hard to wake up post-op   Elevated white blood cell count    due to spherocytosis; states is usually 12,000   Fatty liver    Headache    Hereditary spherocytosis (HCC)    History of hiatal hernia    Hypertension    has not been taking med.; advised to resume medication as directed today (08/30/2014)   Impingement syndrome of left shoulder 08/2014   PONV (postoperative nausea and vomiting)    Spherocytosis, hereditary (HCC)     Family History  Problem Relation Age of Onset   Heart defect Mother        MVP   Aneurysm Mother        Brain   Hypertension Mother    Diabetes Mother    Heart disease Father  CABG x 3   Hypertension Father    Cirrhosis Maternal Aunt    Diabetes Maternal Grandmother    Heart disease Paternal Grandfather        Needed pacemaker   Thyroid disease Neg Hx     Past Surgical History:  Procedure Laterality Date   BILATERAL SALPINGECTOMY N/A 04/02/2016   Procedure: BILATERAL SALPINGECTOMY;  Surgeon: Levi Aland, MD;  Location: [facility];  Service: Gynecology;  Laterality: N/A;   CHOLECYSTECTOMY N/A 03/19/2015   Procedure: LAPAROSCOPIC CHOLECYSTECTOMY WITH INTRAOPERATIVE CHOLANGIOGRAM;  Surgeon: Ovidio Kin, MD;  Location: [facility];  Service: General;  Laterality: N/A;   COLONOSCOPY     FINGER SURGERY Left    thumb   LAPAROSCOPIC ASSISTED VAGINAL HYSTERECTOMY N/A 04/02/2016   Procedure: LAPAROSCOPIC ASSISTED  VAGINAL HYSTERECTOMY 120.4g ;  Surgeon: Levi Aland, MD;  Location: [facility];  Service: Gynecology;  Laterality: N/A;   MYRINGOTOMY     SHOULDER ARTHROSCOPY Right    SHOULDER ARTHROSCOPY WITH SUBACROMIAL DECOMPRESSION Left 09/05/2014   Procedure: SHOULDER ARTHROSCOPY WITH SUBACROMIAL DECOMPRESSION;  Surgeon: Tarry Kos, MD;  Location: [facility];  Service: Orthopedics;  Laterality: Left;   SPLENECTOMY, TOTAL     TONSILLECTOMY     TUBAL LIGATION  04/20/2002   UPPER GI ENDOSCOPY     WISDOM TOOTH EXTRACTION     Social History   Occupational History   Not on file  Tobacco Use   Smoking status: Never   Smokeless tobacco: Never  Substance and Sexual Activity   Alcohol use: Yes    Comment: occasionally   Drug use: No   Sexual activity: Yes    Birth control/protection: Surgical

## 2022-07-29 ENCOUNTER — Ambulatory Visit (INDEPENDENT_AMBULATORY_CARE_PROVIDER_SITE_OTHER): Payer: 59 | Admitting: Orthopedic Surgery

## 2022-07-29 ENCOUNTER — Encounter: Payer: Self-pay | Admitting: Orthopedic Surgery

## 2022-07-29 DIAGNOSIS — Z9889 Other specified postprocedural states: Secondary | ICD-10-CM

## 2022-07-29 NOTE — Progress Notes (Signed)
Post-Op Visit Note   Patient: Bonnie Gibson           Date of Birth: 1975/09/09           MRN: [id number] Visit Date: 07/29/2022 PCP: Clovis Riley, L.August Saucer, MD   Assessment & Plan:  Chief Complaint:  Chief Complaint  Patient presents with   Left Knee - Routine Post Op    06/22/2022 left knee arthroscopy, PLM   Visit Diagnoses:  1. S/P arthroscopy of knee     Plan: Bonnie Gibson is a 47 year old patient is now about 6 weeks out left knee arthroscopy with partial lateral meniscectomy.  Still is having some episodic pain around that medial pes bursa region.  Has had prior injections in that area..  No real abnormality in that area on the MRI scan. On exam there is trace effusion but full range of motion and improving quad strength.  Plan at this time is to continue with quad strengthening exercises.  She did try using the bike but that hurt her right knee and she does not want to really aggravate the right knee because of the amount of arthritis present.  She is okay to return to work Monday with no restrictions.  Suggested that she try topical Voltaren for that medial proximal tibial pain. Follow-Up Instructions: No follow-ups on file.   Orders:  No orders of the defined types were placed in this encounter.  No orders of the defined types were placed in this encounter.   Imaging: No results found.  PMFS History: Patient Active Problem List   Diagnosis Date Noted   Pes anserinus bursitis of left knee 11/06/2020   Primary osteoarthritis of left knee 11/06/2020   Hyperlipidemia 04/21/2019   History of splenectomy 10/11/2017   Fatty liver disease, nonalcoholic 10/08/2017   Hereditary spherocytosis (HCC) 10/08/2017   Hypertensive disorder 10/08/2017   Anxiety disorder 10/08/2017   Obesity (BMI 30.0-34.9) 10/08/2017   Pelvic pain in female 04/02/2016   Gall bladder disease 03/19/2015   Past Medical History:  Diagnosis Date   Anxiety    Arthritis    Complication of anesthesia    hard  to wake up post-op   Elevated white blood cell count    due to spherocytosis; states is usually 12,000   Fatty liver    Headache    Hereditary spherocytosis (HCC)    History of hiatal hernia    Hypertension    has not been taking med.; advised to resume medication as directed today (08/30/2014)   Impingement syndrome of left shoulder 08/2014   PONV (postoperative nausea and vomiting)    Spherocytosis, hereditary (HCC)     Family History  Problem Relation Age of Onset   Heart defect Mother        MVP   Aneurysm Mother        Brain   Hypertension Mother    Diabetes Mother    Heart disease Father        CABG x 3   Hypertension Father    Cirrhosis Maternal Aunt    Diabetes Maternal Grandmother    Heart disease Paternal Grandfather        Needed pacemaker   Thyroid disease Neg Hx     Past Surgical History:  Procedure Laterality Date   BILATERAL SALPINGECTOMY N/A 04/02/2016   Procedure: BILATERAL SALPINGECTOMY;  Surgeon: Levi Aland, MD;  Location: [facility];  Service: Gynecology;  Laterality: N/A;   CHOLECYSTECTOMY N/A 03/19/2015   Procedure: LAPAROSCOPIC CHOLECYSTECTOMY WITH  INTRAOPERATIVE CHOLANGIOGRAM;  Surgeon: Ovidio Kin, MD;  Location: [facility];  Service: General;  Laterality: N/A;   COLONOSCOPY     FINGER SURGERY Left    thumb   LAPAROSCOPIC ASSISTED VAGINAL HYSTERECTOMY N/A 04/02/2016   Procedure: LAPAROSCOPIC ASSISTED VAGINAL HYSTERECTOMY 120.4g ;  Surgeon: Levi Aland, MD;  Location: [facility];  Service: Gynecology;  Laterality: N/A;   MYRINGOTOMY     SHOULDER ARTHROSCOPY Right    SHOULDER ARTHROSCOPY WITH SUBACROMIAL DECOMPRESSION Left 09/05/2014   Procedure: SHOULDER ARTHROSCOPY WITH SUBACROMIAL DECOMPRESSION;  Surgeon: Tarry Kos, MD;  Location: [facility];  Service: Orthopedics;  Laterality: Left;   SPLENECTOMY, TOTAL     TONSILLECTOMY     TUBAL LIGATION  04/20/2002   UPPER GI ENDOSCOPY     WISDOM TOOTH EXTRACTION     Social History    Occupational History   Not on file  Tobacco Use   Smoking status: Never   Smokeless tobacco: Never  Substance and Sexual Activity   Alcohol use: Yes    Comment: occasionally   Drug use: No   Sexual activity: Yes    Birth control/protection: Surgical

## 2023-01-13 ENCOUNTER — Institutional Professional Consult (permissible substitution) (INDEPENDENT_AMBULATORY_CARE_PROVIDER_SITE_OTHER): Payer: 59

## 2023-01-22 ENCOUNTER — Institutional Professional Consult (permissible substitution) (INDEPENDENT_AMBULATORY_CARE_PROVIDER_SITE_OTHER): Payer: 59

## 2023-02-26 ENCOUNTER — Institutional Professional Consult (permissible substitution) (INDEPENDENT_AMBULATORY_CARE_PROVIDER_SITE_OTHER): Payer: 59

## 2023-02-26 ENCOUNTER — Ambulatory Visit (INDEPENDENT_AMBULATORY_CARE_PROVIDER_SITE_OTHER): Payer: 59 | Admitting: Audiology

## 2023-04-20 ENCOUNTER — Encounter: Payer: Self-pay | Admitting: Physician Assistant

## 2023-06-01 NOTE — Progress Notes (Deleted)
 Brigitte Canard, PA-C [address] [place], [place]  [address] Phone: [phone]   Gastroenterology Consultation  Referring Provider:     Rey Catholic, MD Primary Care Physician:  Brenita Callow, Rolena Click.Rozelle Corning, MD (Inactive) Primary Gastroenterologist:  Brigitte Canard, PA-C / *** Reason for Consultation:     Lower Abdominal Pain, Melena        HPI:   Bonnie Gibson is a 47 y.o. y/o female referred for consultation & management  by Brenita Callow, L.Rozelle Corning, MD (Inactive).    Here to evaluate lower abdominal pain and melena.  Current symptoms  11/2016 last abdominal pelvic CT with contrast (to evaluate RLQ pain, N/V): Diffuse hepatic steatosis.  Prior cholecystectomy, splenectomy, and hysterectomy.  No acute abnormality.  Normal appendix.  No previous colonoscopy or GI evaluation.  Family history   Past Medical History:  Diagnosis Date   Anxiety    Arthritis    Complication of anesthesia    hard to wake up post-op   Elevated white blood cell count    due to spherocytosis; states is usually 12,000   Fatty liver    Headache    Hereditary spherocytosis (HCC)    History of hiatal hernia    Hypertension    has not been taking med.; advised to resume medication as directed today (08/30/2014)   Impingement syndrome of left shoulder 08/2014   PONV (postoperative nausea and vomiting)    Spherocytosis, hereditary (HCC)     Past Surgical History:  Procedure Laterality Date   BILATERAL SALPINGECTOMY N/A 04/02/2016   Procedure: BILATERAL SALPINGECTOMY;  Surgeon: Hamp Levine, MD;  Location: [facility];  Service: Gynecology;  Laterality: N/A;   CHOLECYSTECTOMY N/A 03/19/2015   Procedure: LAPAROSCOPIC CHOLECYSTECTOMY WITH INTRAOPERATIVE CHOLANGIOGRAM;  Surgeon: Juanita Norlander, MD;  Location: [facility];  Service: General;  Laterality: N/A;   COLONOSCOPY     FINGER SURGERY Left    thumb   LAPAROSCOPIC ASSISTED VAGINAL HYSTERECTOMY N/A 04/02/2016   Procedure: LAPAROSCOPIC ASSISTED VAGINAL HYSTERECTOMY  120.4g ;  Surgeon: Hamp Levine, MD;  Location: [facility];  Service: Gynecology;  Laterality: N/A;   MYRINGOTOMY     SHOULDER ARTHROSCOPY Right    SHOULDER ARTHROSCOPY WITH SUBACROMIAL DECOMPRESSION Left 09/05/2014   Procedure: SHOULDER ARTHROSCOPY WITH SUBACROMIAL DECOMPRESSION;  Surgeon: Wes Hamman, MD;  Location: [facility];  Service: Orthopedics;  Laterality: Left;   SPLENECTOMY, TOTAL     TONSILLECTOMY     TUBAL LIGATION  04/20/2002   UPPER GI ENDOSCOPY     WISDOM TOOTH EXTRACTION      Prior to Admission medications   Medication Sig Start Date End Date Taking? Authorizing Provider  ALPRAZolam  (XANAX ) 0.5 MG tablet TAKE 1 TABLET BY MOUTH EACH NIGHT AT BEDTIME AS NEEDED FOR ANXIETY 05/08/20   Hilts, Bambi Lever, MD  APPLE CIDER VINEGAR PO Take by mouth daily.    [provider]  Ascorbic Acid (VITAMIN C PO) Take by mouth daily.    [provider]  celecoxib  (CELEBREX ) 100 MG capsule Take 1 capsule (100 mg total) by mouth 2 (two) times daily. 06/22/22 06/22/23  Magnant, Justice Olp, PA-C  Cholecalciferol (VITAMIN D3 PO) Take by mouth daily.    [provider]  Cyanocobalamin (VITAMIN B-12 PO) Take by mouth daily.    [provider]  cycloSPORINE (RESTASIS) 0.05 % ophthalmic emulsion Restasis 0.05 % eye drops in a dropperette    [provider]  diclofenac  (VOLTAREN ) 75 MG EC tablet Take 1  tablet (75 mg total) by mouth 2 (two) times daily as needed. 06/25/20   Hilts, Bambi Lever, MD  Diethylpropion  HCl 25 MG TABS Take 1 tablet (25 mg total) by mouth in the morning, at noon, and at bedtime. 03/11/20   Hilts, Bambi Lever, MD  diphenoxylate -atropine  (LOMOTIL ) 2.5-0.025 MG tablet Take 1-2 tablets by mouth 4 (four) times daily as needed for diarrhea or loose stools. 09/16/20   Hilts, Bambi Lever, MD  escitalopram  (LEXAPRO ) 5 MG tablet Take 1 tablet (5 mg total) by mouth daily. 04/09/20   Hilts, Bambi Lever, MD  lisinopril  (ZESTRIL ) 10 MG tablet TAKE 1/2 TABLET BY MOUTH  DAILY TO START,MAY INCREASE TO 1 DAILY IF NEEDED 04/09/20   Hilts, Bambi Lever, MD  methylPREDNISolone  (MEDROL  DOSEPAK) 4 MG TBPK tablet As directed for 6 days. 01/30/20   Hilts, Bambi Lever, MD  montelukast  (SINGULAIR ) 10 MG tablet Take 1 tablet (10 mg total) by mouth daily as needed. 05/13/20   Hilts, Bambi Lever, MD  ondansetron  (ZOFRAN ) 4 MG tablet Take 1 tablet (4 mg total) by mouth every 8 (eight) hours as needed for nausea or vomiting. 09/23/20   Sandie Cross, PA-C  ondansetron  (ZOFRAN -ODT) 4 MG disintegrating tablet Take 1 tablet (4 mg total) by mouth 3 (three) times daily as needed for nausea or vomiting. 06/03/20   Hilts, Bambi Lever, MD  oxyCODONE  (ROXICODONE ) 5 MG immediate release tablet Take 1 tablet (5 mg total) by mouth every 4 (four) hours as needed for severe pain. 06/22/22   Magnant, Charles L, PA-C  predniSONE  (DELTASONE ) 10 MG tablet Take as directed for 12 days.  Daily dose 6,6,5,5,4,4,3,3,2,2,1,1. 05/13/20   Hilts, Bambi Lever, MD  predniSONE  (DELTASONE ) 10 MG tablet Take as directed for 12 days.  Daily dose 6,6,5,5,4,4,3,3,2,2,1,1. 05/28/20   Hilts, Bambi Lever, MD    Family History  Problem Relation Age of Onset   Heart defect Mother        MVP   Aneurysm Mother        Brain   Hypertension Mother    Diabetes Mother    Heart disease Father        CABG x 3   Hypertension Father    Cirrhosis Maternal Aunt    Diabetes Maternal Grandmother    Heart disease Paternal Grandfather        Needed pacemaker   Thyroid  disease Neg Hx      Social History   Tobacco Use   Smoking status: Never   Smokeless tobacco: Never  Substance Use Topics   Alcohol use: Yes    Comment: occasionally   Drug use: No    Allergies as of 06/02/2023 - Review Complete 07/29/2022  Allergen Reaction Noted   Other Rash 03/18/2015    Review of Systems:    All systems reviewed and negative except where noted in HPI.   Physical Exam:  LMP 03/25/2016 (Exact Date)  Patient's last menstrual period was 03/25/2016 (exact  date).  General:   Alert,  Well-developed, well-nourished, pleasant and cooperative in NAD Lungs:  Respirations even and unlabored.  Clear throughout to auscultation.   No wheezes, crackles, or rhonchi. No acute distress. Heart:  Regular rate and rhythm; no murmurs, clicks, rubs, or gallops. Abdomen:  Normal bowel sounds.  No bruits.  Soft, and non-distended without masses, hepatosplenomegaly or hernias noted.  No Tenderness.  No guarding or rebound tenderness.    Neurologic:  Alert and oriented x3;  grossly normal neurologically. Psych:  Alert and cooperative. Normal mood and affect.  Imaging Studies: No results found.  Assessment and Plan:   Bonnie Gibson is a 48 y.o. y/o female has been referred for ***  Follow up ***  Brigitte Canard, PA-C

## 2023-06-02 ENCOUNTER — Ambulatory Visit: Admitting: Physician Assistant
